# Patient Record
Sex: Male | Born: 1950 | Race: White | Hispanic: No | Marital: Married | Smoking: Former smoker
Health system: Southern US, Community
[De-identification: ages and names within clinical notes are randomized; demographics above are authoritative.]

## PROBLEM LIST (undated history)

## (undated) DIAGNOSIS — R600 Localized edema: Secondary | ICD-10-CM

## (undated) DIAGNOSIS — K7460 Unspecified cirrhosis of liver: Secondary | ICD-10-CM

## (undated) DIAGNOSIS — K769 Liver disease, unspecified: Secondary | ICD-10-CM

## (undated) DIAGNOSIS — K602 Anal fissure, unspecified: Secondary | ICD-10-CM

## (undated) DIAGNOSIS — G4730 Sleep apnea, unspecified: Secondary | ICD-10-CM

## (undated) DIAGNOSIS — G5600 Carpal tunnel syndrome, unspecified upper limb: Secondary | ICD-10-CM

## (undated) DIAGNOSIS — D649 Anemia, unspecified: Secondary | ICD-10-CM

## (undated) DIAGNOSIS — A4152 Sepsis due to Pseudomonas: Secondary | ICD-10-CM

## (undated) DIAGNOSIS — I5031 Acute diastolic (congestive) heart failure: Secondary | ICD-10-CM

## (undated) DIAGNOSIS — I10 Essential (primary) hypertension: Secondary | ICD-10-CM

## (undated) DIAGNOSIS — N400 Enlarged prostate without lower urinary tract symptoms: Secondary | ICD-10-CM

## (undated) HISTORY — DX: Essential (primary) hypertension: I10

## (undated) HISTORY — DX: Anemia, unspecified: D64.9

## (undated) HISTORY — PX: INGUINAL HERNIA REPAIR: SUR1180

## (undated) HISTORY — DX: Unspecified cirrhosis of liver: K74.60

## (undated) HISTORY — DX: Localized edema: R60.0

## (undated) HISTORY — DX: Acute diastolic (congestive) heart failure: I50.31

## (undated) HISTORY — DX: Sleep apnea, unspecified: G47.30

## (undated) HISTORY — DX: Benign prostatic hyperplasia without lower urinary tract symptoms: N40.0

## (undated) HISTORY — PX: CARPAL TUNNEL RELEASE: SHX101

## (undated) HISTORY — DX: Sepsis due to Pseudomonas: A41.52

## (undated) HISTORY — DX: Carpal tunnel syndrome, unspecified upper limb: G56.00

## (undated) HISTORY — DX: Liver disease, unspecified: K76.9

## (undated) HISTORY — DX: Anal fissure, unspecified: K60.2

## (undated) HISTORY — PX: HEMORRHOID SURGERY: SHX153

---

## 2007-07-09 ENCOUNTER — Ambulatory Visit: Payer: Self-pay | Admitting: Internal Medicine

## 2007-07-24 ENCOUNTER — Ambulatory Visit: Payer: Self-pay | Admitting: Internal Medicine

## 2007-08-09 ENCOUNTER — Ambulatory Visit: Payer: Self-pay | Admitting: Internal Medicine

## 2007-09-06 ENCOUNTER — Ambulatory Visit: Payer: Self-pay | Admitting: Internal Medicine

## 2007-09-14 ENCOUNTER — Ambulatory Visit: Payer: Self-pay

## 2007-09-23 ENCOUNTER — Ambulatory Visit: Payer: Self-pay | Admitting: Unknown Physician Specialty

## 2007-10-07 ENCOUNTER — Ambulatory Visit: Payer: Self-pay

## 2007-10-07 ENCOUNTER — Ambulatory Visit: Payer: Self-pay | Admitting: Internal Medicine

## 2007-10-27 ENCOUNTER — Ambulatory Visit: Payer: Self-pay

## 2007-11-06 ENCOUNTER — Ambulatory Visit: Payer: Self-pay | Admitting: Radiation Oncology

## 2007-11-06 ENCOUNTER — Ambulatory Visit: Payer: Self-pay

## 2007-12-07 ENCOUNTER — Ambulatory Visit: Payer: Self-pay

## 2007-12-11 ENCOUNTER — Ambulatory Visit: Payer: Self-pay | Admitting: Internal Medicine

## 2008-02-16 ENCOUNTER — Ambulatory Visit: Payer: Self-pay

## 2008-03-03 ENCOUNTER — Other Ambulatory Visit: Payer: Self-pay

## 2008-03-03 ENCOUNTER — Ambulatory Visit: Payer: Self-pay | Admitting: General Practice

## 2008-03-08 ENCOUNTER — Ambulatory Visit: Payer: Self-pay

## 2008-03-17 ENCOUNTER — Ambulatory Visit: Payer: Self-pay | Admitting: General Practice

## 2008-06-16 ENCOUNTER — Encounter: Payer: Self-pay | Admitting: Cardiovascular Disease

## 2008-08-04 ENCOUNTER — Ambulatory Visit: Payer: Self-pay | Admitting: Otolaryngology

## 2008-08-25 ENCOUNTER — Ambulatory Visit: Payer: Self-pay | Admitting: Otolaryngology

## 2008-10-12 ENCOUNTER — Encounter: Payer: Self-pay | Admitting: Cardiovascular Disease

## 2009-08-07 ENCOUNTER — Encounter: Payer: Self-pay | Admitting: Cardiovascular Disease

## 2009-08-16 ENCOUNTER — Ambulatory Visit: Payer: Self-pay | Admitting: Cardiovascular Disease

## 2009-08-16 DIAGNOSIS — Z87898 Personal history of other specified conditions: Secondary | ICD-10-CM

## 2009-08-16 DIAGNOSIS — K7460 Unspecified cirrhosis of liver: Secondary | ICD-10-CM

## 2009-08-16 DIAGNOSIS — K259 Gastric ulcer, unspecified as acute or chronic, without hemorrhage or perforation: Secondary | ICD-10-CM | POA: Insufficient documentation

## 2009-08-16 DIAGNOSIS — Z8669 Personal history of other diseases of the nervous system and sense organs: Secondary | ICD-10-CM

## 2009-08-16 DIAGNOSIS — G4730 Sleep apnea, unspecified: Secondary | ICD-10-CM | POA: Insufficient documentation

## 2009-08-16 DIAGNOSIS — N401 Enlarged prostate with lower urinary tract symptoms: Secondary | ICD-10-CM

## 2009-09-05 ENCOUNTER — Encounter: Payer: Self-pay | Admitting: Cardiovascular Disease

## 2009-09-05 ENCOUNTER — Ambulatory Visit: Payer: Self-pay

## 2009-09-07 ENCOUNTER — Ambulatory Visit: Payer: Self-pay | Admitting: Cardiovascular Disease

## 2009-09-11 ENCOUNTER — Telehealth: Payer: Self-pay | Admitting: Cardiovascular Disease

## 2010-01-29 ENCOUNTER — Ambulatory Visit: Payer: Self-pay | Admitting: Unknown Physician Specialty

## 2010-08-07 NOTE — Assessment & Plan Note (Signed)
Summary: F/U 2 WEEKS   Visit Type:  Follow-up Referring Provider:  Lenise Jr Primary Provider:  Kern Alberta  CC:  edema in ankles and feet.  History of Present Illness: 60 yo male, patient of Dr. 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 He presents for f/u.   Mr. Raul Del states that overall he has been feeling well. He states that he is also significant weight over the past year but continues to need more weight reduction. He follows up with Dr. Raford Pitcher at [facility] for his liver issues and states that he had an MRI done in December 2010.  Echocardiogram showed grossly normal systolic function, no significant valvular disease, normal right ventricular systolic pressures. He continues to have lower extremity edema bilaterally most notably below the knees. It is better in the morning and certainly worse by the end of the day after he has been on his feet. He does exercise on a treadmill and an elliptical a periodic basis.    Current Problems (verified): 1)  Morbid Obesity  (ICD-278.01) 2)  Sleep Apnea  (ICD-780.57) 3)  Edema- Localized  (ICD-782.3) 4)  Carpal Tunnel Syndrome, Hx of  (ICD-V12.49) 5)  Hepatic Cirrhosis  (ICD-571.5) 6)  Inguinal Hernia, Hx of  (ICD-V13.8) 7)  Gastric Ulcer  (ICD-531.90) 8)  Benign Prostatic Hypertrophy, With Obstruction  (ICD-600.01)  Current Medications (verified): 1)  Nadolol 20 Mg Tabs (Nadolol) .Marland Kitchen.. 1 Tab By Mouth Daily 2)  Omeprazole 20 Mg Cpdr (Omeprazole) .... 2 Tab By Mouth Daily 3)  Flomax 0.4 Mg Caps (Tamsulosin Hcl) .Marland Kitchen.. 1 Tab By Mouth Daily 4)  Furosemide 40 Mg Tabs (Furosemide) .Marland Kitchen.. 1 Tab By Mouth Daily 5)  Spironolactone 100 Mg Tabs (Spironolactone) .Marland Kitchen.. 1 Tab By Mouth Daily 6)  Nasonex 50 Mcg/act Susp (Mometasone Furoate) .Marland Kitchen.. 1 Spray in Each Nostril Daily 7)  Cpap .... Use At  Bedtime  Allergies (verified): No Known Drug Allergies  Past History:  Past Medical History: Last updated: 08/16/2009 BPH gastric ulcer inguinal hernia --repair cirrhosis of the liver carpel tunnel lower extremity edema sleep apnea -- CPAP  Past Surgical History: Last updated: 08/16/2009 inguinal hernia repair carpel tunnel repair  Family History: Last updated: 08/16/2009 Mother- lung CA Father- prostrate CA, skin CA, CAD, DM, HTN, nephorlithasis  Review of Systems       The patient complains of peripheral edema.  The patient denies fatigue, malaise, fever, weight gain/loss, vision loss, decreased hearing, hoarseness, chest pain, palpitations, shortness of breath, prolonged cough, wheezing, sleep apnea, coughing up blood, abdominal pain, blood in stool, nausea, vomiting, diarrhea, heartburn, incontinence, blood in urine, muscle weakness, joint pain, leg swelling, rash, skin lesions, headache, fainting, dizziness, depression, anxiety, enlarged lymph nodes, easy bruising or bleeding, and environmental allergies.    Vital Signs:  Patient profile:   60 year old male Height:      71 inches Weight:      331 pounds BMI:     46.33 Pulse rate:   52 / minute Pulse rhythm:   regular BP sitting:   120 / 68  (left arm) Cuff size:   large  Vitals Entered By: Mercer Pod (September 07, 2009 10:49 AM)   Impression & Recommendations:  Problem # 1:  EDEMA- LOCALIZED (ICD-782.3) the echocardiogram shows no LV dysfunction, normal right ventricular systolic pressures. Given  this, his edema is likely due to chronic venous stasis. His weight/abdomen may also be contributing to elevated venous pressures. We have suggested that he try TED hose below the knees bilaterally. He also may want to be evaluated by a vascular/vein specialist and if his symptoms get worse, have vein mapping.  Problem # 2:  MORBID OBESITY (ICD-278.01) We did talk to him about his weight. He is certainly at risk of  coronary disease given his strong family history. His father had an MI at age 60 with bypass at age 30 with redo bypass several years later. We'll try to obtain his most recent lipid panel from Dr. Lorri Frederick. Certainly needs to lose more weight. I will try to obtain his MRI report from [facility] to see if the coronary anatomy was visualized and whether he has the start of coronary atherosclerosis. If he does have underlying coronary disease seen on imaging, he may benefit from a low-dose statin even despite his underlying NASH. We would have to discuss this with his hepatologist.  As he is over 50 and did slightly higher risk given his family history, smoking history, and underlying obesity, he may benefit from a low-dose aspirin. Will try to get his platelet count and will discuss this with his hepatologist prior to initiating a low-dose aspirin.  Patient Instructions: 1)  Your physician recommends that you schedule a follow-up appointment in: 1 year 2)  Your physician recommends that you continue on your current medications as directed. Please refer to the Current Medication list given to you today.

## 2010-08-07 NOTE — Progress Notes (Signed)
Summary: [employer]prim[employer]  Imprimis   Imported By: Harlon Flor 08/22/2009 09:17:56  _____________________________________________________________________  External Attachment:    Type:   Image     Comment:   External Document

## 2010-08-07 NOTE — Progress Notes (Signed)
Summary: [employer]  [employer]   Imported By: Harlon Flor 08/22/2009 09:18:21  _____________________________________________________________________  External Attachment:    Type:   Image     Comment:   External Document

## 2010-08-07 NOTE — Progress Notes (Signed)
Summary: CALL BACK  Phone Note Call from Patient Call back at Work Phone [phone]   Caller: SELF Call For: [facility] Summary of Call: RETURNING A CALL TO [facility] FROM FRIDAY Initial call taken by: Harlon Flor,  September 11, 2009 11:58 AM  Follow-up for Phone Call        gave pt results of echo Follow-up by: Mercer Pod,  September 11, 2009 4:09 PM

## 2010-08-07 NOTE — Progress Notes (Signed)
Summary: [employer]  [employer]   Imported By: Harlon Flor 08/22/2009 09:18:42  _____________________________________________________________________  External Attachment:    Type:   Image     Comment:   External Document

## 2010-08-07 NOTE — Progress Notes (Signed)
Summary: PHI  PHI   Imported By: Harlon Flor 08/22/2009 09:27:32  _____________________________________________________________________  External Attachment:    Type:   Image     Comment:   External Document

## 2010-08-07 NOTE — Assessment & Plan Note (Signed)
Summary: NEW PT;   Visit Type:  new pt visit Primary Provider:  Kern Alberta  CC:  no cardiac complaints today.  History of Present Illness: 60 yo male, patient of Dr. 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 He presents for cardiac evaluation.  Mr. Shugars states that he has no significant SOB with exertion, no chest pain. His LE edema is nontender, improves with leg elevation. He has been on lasix, spironolactone with no significant improvement. He exercises on a treadmill several times a week, per his report. He denies ABD swelling, PND, orthopnea. No cough.     Problems Prior to Update: 1)  Sleep Apnea  (ICD-780.57) 2)  Edema- Localized  (ICD-782.3) 3)  Carpal Tunnel Syndrome, Hx of  (ICD-V12.49) 4)  Hepatic Cirrhosis  (ICD-571.5) 5)  Inguinal Hernia, Hx of  (ICD-V13.8) 6)  Gastric Ulcer  (ICD-531.90) 7)  Benign Prostatic Hypertrophy, With Obstruction  (ICD-600.01)  Current Medications (verified): 1)  Nadolol 20 Mg Tabs (Nadolol) .Marland Kitchen.. 1 Tab By Mouth Daily 2)  Omeprazole 20 Mg Cpdr (Omeprazole) .... 2 Tab By Mouth Daily 3)  Flomax 0.4 Mg Caps (Tamsulosin Hcl) .Marland Kitchen.. 1 Tab By Mouth Daily 4)  Furosemide 40 Mg Tabs (Furosemide) .Marland Kitchen.. 1 Tab By Mouth Daily 5)  Spironolactone 100 Mg Tabs (Spironolactone) .Marland Kitchen.. 1 Tab By Mouth Daily 6)  Nasonex 50 Mcg/act Susp (Mometasone Furoate) .Marland Kitchen.. 1 Spray in Each Nostril Daily  Allergies (verified): No Known Drug Allergies  Vital Signs:  Patient profile:   60 year old male Height:      71 inches Weight:      324 pounds BMI:     45.35 Pulse rate:   54 / minute Pulse rhythm:   regular BP sitting:   134 / 74  (left arm) Cuff size:   large  Vitals Entered By: Danielle Rankin, CMA (August 16, 2009 3:21 PM)  Physical Exam  General:  HEENT is benign, neck is supple with no carotid bruits, no  JVP, cv: RRR with no murmur, resp: CTA b/l, ABD: Benign, obese, NT, ND, Extr:2+ LE edema to below the knees,  neuro:  no focal deficts, pulses: equal and symmetrical upper and lower extremities.   New Orders:     1)  Echocardiogram (Echo)   EKG  Procedure date:  08/16/2009  Findings:      NSR, no significant ST or T wave changes. Rate of 55 bpm.   Impression & Recommendations:  Problem # 1:  EDEMA- LOCALIZED (ICD-782.3) Etiology of the edema is uncertain. We have ordered an ECHO to evaluate cardiac function, r/o valve d/o, measure RVSP (and pulmonary HTN). If his echo is grossly normal, his edema may be seconadry to venous stasis. We will try to obtain his labs to evaluate albumin level.  Uncertain if edema is secondary to underlying cirrhosis though there is no ABD swelling suggesting decompensated liver disease.  Orders: Echocardiogram (Echo)  Problem # 2:  HEPATIC CIRRHOSIS (ICD-571.5) Followed by GI, Dr. Markham Jordan. Presentation concerning for NASH, though details are not available. Varices by history.   Problem # 3:  SLEEP APNEA (ICD-780.57) Currently using CPAP and states he is doing well.  Problem # 4:  MORBID OBESITY (ICD-278.01) Have discussed with him that his weight is a risk factor for numerous disease processes, includingn cardiac disease. Will discuss further  with him in f/u after echo results obtained. Will try to obtain most recent cholesterol.   Patient Instructions: 1)  Your physician recommends that you schedule a follow-up appointment in: 2 weeks 2)  Your physician has requested that you have an echocardiogram.  Echocardiography is a painless test that uses sound waves to create images of your heart. It provides your doctor with information about the size and shape of your heart and how well your heart's chambers and valves are working.  This procedure takes approximately one hour. There are no restrictions for this procedure.  Prevention & Chronic  Care Immunizations   Influenza vaccine: Not documented    Tetanus booster: Not documented    Pneumococcal vaccine: Not documented  Colorectal Screening   Hemoccult: Not documented    Colonoscopy: Not documented  Other Screening   PSA: Not documented   Smoking status: Not documented  Lipids   Total Cholesterol: Not documented   LDL: Not documented   LDL Direct: Not documented   HDL: Not documented   Triglycerides: Not documented   Appended Document: NEW PT; No recent cholesterol labs available

## 2010-08-07 NOTE — Letter (Signed)
Summary: Medical Record Release  Medical Record Release   Imported By: Harlon Flor 09/08/2009 10:20:08  _____________________________________________________________________  External Attachment:    Type:   Image     Comment:   External Document

## 2010-12-24 ENCOUNTER — Inpatient Hospital Stay: Payer: Self-pay | Admitting: Internal Medicine

## 2010-12-24 ENCOUNTER — Ambulatory Visit: Payer: Self-pay | Admitting: Family Medicine

## 2010-12-25 DIAGNOSIS — I369 Nonrheumatic tricuspid valve disorder, unspecified: Secondary | ICD-10-CM

## 2011-01-16 ENCOUNTER — Encounter: Payer: Self-pay | Admitting: Cardiovascular Disease

## 2011-05-15 ENCOUNTER — Encounter: Payer: Self-pay | Admitting: Cardiovascular Disease

## 2011-05-17 ENCOUNTER — Encounter: Payer: Self-pay | Admitting: Cardiovascular Disease

## 2011-05-17 ENCOUNTER — Ambulatory Visit (INDEPENDENT_AMBULATORY_CARE_PROVIDER_SITE_OTHER): Payer: PRIVATE HEALTH INSURANCE | Admitting: Cardiovascular Disease

## 2011-05-17 DIAGNOSIS — R609 Edema, unspecified: Secondary | ICD-10-CM

## 2011-05-17 DIAGNOSIS — Z8249 Family history of ischemic heart disease and other diseases of the circulatory system: Secondary | ICD-10-CM

## 2011-05-17 DIAGNOSIS — R0602 Shortness of breath: Secondary | ICD-10-CM

## 2011-05-17 DIAGNOSIS — G4730 Sleep apnea, unspecified: Secondary | ICD-10-CM

## 2011-05-17 DIAGNOSIS — K7460 Unspecified cirrhosis of liver: Secondary | ICD-10-CM

## 2011-05-17 NOTE — Patient Instructions (Signed)
You are doing well. No medication changes were made.  Gastric bypass/Lap band: Dr. Primus Bravo [phone]  Please call us if you have new issues that need to be addressed before your next appt.  The office will contact you for a follow up Appt. In 12 months

## 2011-05-19 NOTE — Assessment & Plan Note (Signed)
Underlying sleep apnea for obesity is another reason for lab or gastric bypass procedure

## 2011-05-19 NOTE — Progress Notes (Signed)
Patient ID: [name], male    DOB: [birth year], 60 y.o.   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 He presents for f/u.      He follows up with Dr. Raford Pitcher at [facility] for his liver issues and states that he had an MRI done in December 2010. He has been on Lasix for lower extremity edema. He was recently admitted to the hospital at the end of June for acute respiratory failure, started on BiPAP, also with hypotension, acute renal failure secondary to hypovolemia. He was in the ICU for several days on BiPAP and pressors. It was felt that he had bacteremia as he had chills and fever prior to presentation. Notes indicate etiology of his infection was uncertain. He was started on Zosyn for 7 days. Possibly related to his underlying liver disease (?SBP).  BUN 2 6 and creatinine 2.07 on arrival to the hospital. Creatinine on discharge is 1.0  He has had no further episodes since that time   Echocardiogram done in the hospital showed normal LV systolic function, normal biventricular systolic pressures  EKG today shows normal sinus rhythm with rate 59 beats per minute, no significant ST or T wave changes      Outpatient Encounter Prescriptions as of 05/17/2011  Medication Sig Dispense Refill  . furosemide (LASIX) 40 MG tablet Take 40 mg alternate with 60 mg daily.      . mometasone (NASONEX) 50 MCG/ACT nasal spray Place 2 sprays into the nose daily.        . nadolol (CORGARD) 20 MG tablet Take 20 mg by mouth daily.        . NON FORMULARY CPAP. Use at bedtime.       Marland Kitchen omeprazole (PRILOSEC) 20 MG capsule Take 40 mg by mouth daily.        Marland Kitchen spironolactone (ALDACTONE) 100 MG tablet Take one (100 mg) tablet alternate with one (100 mg) and half (50)  tablet daily.      Marland Kitchen DISCONTD: Tamsulosin HCl (FLOMAX) 0.4 MG CAPS Take 0.4  mg by mouth daily.           Review of Systems  Constitutional: Negative.   HENT: Negative.   Eyes: Negative.   Respiratory: Negative.   Cardiovascular: Positive for leg swelling.  Gastrointestinal: Negative.   Musculoskeletal: Negative.   Skin: Negative.   Neurological: Negative.   Hematological: Negative.   Psychiatric/Behavioral: Negative.   All other systems reviewed and are negative.    BP 140/62  Pulse 59  Ht 5\' 11"  (1.803 m)  Wt 372 lb (168.738 kg)  BMI 51.88 kg/m2  Physical Exam  Nursing note and vitals reviewed. Constitutional: He is oriented to person, place, and time. He appears well-developed and well-nourished.  HENT:  Head: Normocephalic.  Nose: Nose normal.  Mouth/Throat: Oropharynx is clear and moist.  Eyes: Conjunctivae are normal. Pupils are equal, round, and reactive to light.  Neck: Normal range of motion. Neck supple. No JVD present.  Cardiovascular: Normal rate, regular rhythm, S1 normal, S2 normal, normal heart sounds and intact distal pulses.  Exam reveals no gallop and no friction rub.   No murmur heard. Pulmonary/Chest: Effort normal and breath sounds normal. No respiratory distress. He has no wheezes. He has no rales. He exhibits no tenderness.  Abdominal: Soft. Bowel sounds are  normal. He exhibits no distension. There is no tenderness.  Musculoskeletal: Normal range of motion. He exhibits no edema and no tenderness.  Lymphadenopathy:    He has no cervical adenopathy.  Neurological: He is alert and oriented to person, place, and time. Coordination normal.  Skin: Skin is warm and dry. No rash noted. No erythema.  Psychiatric: He has a normal mood and affect. His behavior is normal. Judgment and thought content normal.           Assessment and Plan

## 2011-05-19 NOTE — Assessment & Plan Note (Signed)
[name] is secondary to obesity. He does have cirrhosis, varices as well and is followed by hepatology ( at [facility]?) We have stressed the importance of weight loss.

## 2011-05-19 NOTE — Assessment & Plan Note (Signed)
Edema is likely secondary to venous insufficiency, exacerbated by his obesity. Possible component of lymphedema. We have encouraged TED hose, gentle diuresis only.

## 2011-05-19 NOTE — Assessment & Plan Note (Signed)
We had a long discussion with him concerning his obesity. We have given him a contact number for Dr. Smitty Cords in [facility] for evaluation for left and/or gastric bypass surgery. His obesity is causing significant health problems including underlying cirrhosis from [place].

## 2011-06-28 ENCOUNTER — Encounter: Payer: Self-pay | Admitting: Cardiovascular Disease

## 2012-07-08 DIAGNOSIS — D649 Anemia, unspecified: Secondary | ICD-10-CM

## 2012-07-08 DIAGNOSIS — K602 Anal fissure, unspecified: Secondary | ICD-10-CM

## 2012-07-08 HISTORY — DX: Anal fissure, unspecified: K60.2

## 2012-07-08 HISTORY — DX: Anemia, unspecified: D64.9

## 2012-07-17 ENCOUNTER — Ambulatory Visit (INDEPENDENT_AMBULATORY_CARE_PROVIDER_SITE_OTHER): Payer: 59 | Admitting: Cardiovascular Disease

## 2012-07-17 ENCOUNTER — Encounter: Payer: Self-pay | Admitting: Cardiovascular Disease

## 2012-07-17 VITALS — BP 138/60 | HR 61 | Ht 71.0 in | Wt 382.5 lb

## 2012-07-17 DIAGNOSIS — N401 Enlarged prostate with lower urinary tract symptoms: Secondary | ICD-10-CM

## 2012-07-17 DIAGNOSIS — K7460 Unspecified cirrhosis of liver: Secondary | ICD-10-CM

## 2012-07-17 DIAGNOSIS — R609 Edema, unspecified: Secondary | ICD-10-CM

## 2012-07-17 DIAGNOSIS — R0602 Shortness of breath: Secondary | ICD-10-CM

## 2012-07-17 DIAGNOSIS — D649 Anemia, unspecified: Secondary | ICD-10-CM | POA: Insufficient documentation

## 2012-07-17 NOTE — Patient Instructions (Addendum)
You are doing well. No medication changes were made.  Please call us if you have new issues that need to be addressed before your next appt.  Your physician wants you to follow-up in: 12 months.  You will receive a reminder letter in the mail two months in advance. If you don't receive a letter, please call our office to schedule the follow-up appointment. 

## 2012-07-17 NOTE — Assessment & Plan Note (Signed)
Underlying anemia would likely contribute to his shortness of breath, edema. Recent blood loss from hemorrhoid surgeries. Uncertain if he has iron deficiency. We have recommended he eat foods rich in iron, possibly take low-dose over-the-counter iron supplement.

## 2012-07-17 NOTE — Assessment & Plan Note (Signed)
Stable nonpitting edema. Likely secondary to venous insufficiency, unable to exclude some component of lymphedema. Edema likely be made worse by underlying anemia and obesity, diastolic  CHF. We have suggested he stay on Lasix twice a day.

## 2012-07-17 NOTE — Assessment & Plan Note (Signed)
Followed  At [facility]. Elita Boone.

## 2012-07-17 NOTE — Progress Notes (Signed)
Patient ID: [name], male    DOB: Aug 10, 1950, 62 y.o.   MRN: [id number]  HPI Comments: 62 year old male with pmh of obesity, cirrhosis secondary to NASH, with varices on prior EGD, OSA on CPAP, smoking hx x 25 years (stopped in 2000), with chronic lower extremity edema. He presents for f/u.      He follows up with Dr. Raford Pitcher at [facility] for his liver issues. MRI done in December 2010. He has been on Lasix for lower extremity edema 40 mg twice a day.   admitted to the hospital at the end of June 2012 for acute respiratory failure, started on BiPAP, also with hypotension, acute renal failure secondary to hypovolemia. He was in the ICU for several days on BiPAP and pressors. It was felt that he had bacteremia as he had chills and fever prior to presentation. Notes indicate etiology of his infection was uncertain. He was started on Zosyn for 7 days. Possibly related to his underlying liver disease (?SBP).  BUN 2 6 and creatinine 2.07 on arrival to the hospital. Creatinine on discharge is 1.0  He reports that he's had problems with hemorrhoids, recent surgery for hemorrhoid banding. This was done in November 2013. He had postop pain control issues, urine retention and stayed in the hospital. Also a diagnosis of anal fissure that still needs surgical correction.  He has anemia with recent hematocrit 28 and low platelets. Scheduled for EGD and colonoscopy given his history of varices and portal hypertension.  He reports that he has shortness of breath but this is stable and he attributes this to his obesity, deconditioning, also likely his anemia   Prior Echocardiogram done in the hospital showed normal LV systolic function, normal biventricular systolic pressures no significant valvular pathology  EKG today shows normal sinus rhythm with rate 61 beats per minute, no significant ST or T wave changes      Outpatient Encounter Prescriptions as of 07/17/2012  Medication Sig Dispense  Refill  . furosemide (LASIX) 40 MG tablet Take 40 mg by mouth 2 (two) times daily.       Marland Kitchen lidocaine (XYLOCAINE) 2 % jelly Apply 1 application topically daily as needed.       . mometasone (NASONEX) 50 MCG/ACT nasal spray Place 2 sprays into the nose daily as needed.       . nadolol (CORGARD) 20 MG tablet Take 20 mg by mouth daily.        Marland Kitchen NIFEdipine POWD as needed.       . NON FORMULARY CPAP at bedtime.      Marland Kitchen omeprazole (PRILOSEC) 20 MG capsule Take 40 mg by mouth daily.        Marland Kitchen PROCTOSOL HC 2.5 % rectal cream Place 1 application rectally 2 (two) times daily as needed.       Marland Kitchen RECTIV 0.4 % OINT       . spironolactone (ALDACTONE) 100 MG tablet Take 50 mg by mouth 2 (two) times daily.       . Tamsulosin HCl (FLOMAX) 0.4 MG CAPS Place 0.4 mg into alternate nostrils daily.          Review of Systems  Constitutional: Negative.   HENT: Negative.   Eyes: Negative.   Respiratory: Negative.   Cardiovascular: Positive for leg swelling.  Gastrointestinal: Negative.   Musculoskeletal: Negative.   Skin: Negative.   Neurological: Negative.   Hematological: Negative.   Psychiatric/Behavioral: Negative.   All other systems reviewed and are negative.  BP 138/60  Pulse 61  Ht 5\' 11"  (1.803 m)  Wt 382 lb 8 oz (173.501 kg)  BMI 53.35 kg/m2  Physical Exam  Nursing note and vitals reviewed. Constitutional: He is oriented to person, place, and time. He appears well-developed and well-nourished.  HENT:  Head: Normocephalic.  Nose: Nose normal.  Mouth/Throat: Oropharynx is clear and moist.  Eyes: Conjunctivae normal are normal. Pupils are equal, round, and reactive to light.  Neck: Normal range of motion. Neck supple. No JVD present.  Cardiovascular: Normal rate, regular rhythm, S1 normal, S2 normal, normal heart sounds and intact distal pulses.  Exam reveals no gallop and no friction rub.   No murmur heard.      Edema is nonpitting, compression hose in place  Pulmonary/Chest: Effort  normal and breath sounds normal. No respiratory distress. He has no wheezes. He has no rales. He exhibits no tenderness.  Abdominal: Soft. Bowel sounds are normal. He exhibits no distension. There is no tenderness.  Musculoskeletal: Normal range of motion. He exhibits edema. He exhibits no tenderness.  Lymphadenopathy:    He has no cervical adenopathy.  Neurological: He is alert and oriented to person, place, and time. Coordination normal.  Skin: Skin is warm and dry. No rash noted. No erythema.  Psychiatric: He has a normal mood and affect. His behavior is normal. Judgment and thought content normal.           Assessment and Plan

## 2012-07-17 NOTE — Assessment & Plan Note (Addendum)
Limited in his ability to exercise. We have asked him to watch his diet closely. We did discuss microscopic banding eared uncertain if he would be a candidate. He reports no insurance coverage for this procedure.

## 2012-09-05 ENCOUNTER — Ambulatory Visit: Payer: Self-pay | Admitting: Family Medicine

## 2012-12-31 ENCOUNTER — Inpatient Hospital Stay: Payer: Self-pay | Admitting: Internal Medicine

## 2012-12-31 LAB — BASIC METABOLIC PANEL
BUN: 19 mg/dL — ABNORMAL HIGH (ref 7–18)
Calcium, Total: 8.4 mg/dL — ABNORMAL LOW (ref 8.5–10.1)
Co2: 27 mmol/L (ref 21–32)
Glucose: 98 mg/dL (ref 65–99)
Potassium: 4.1 mmol/L (ref 3.5–5.1)
Sodium: 140 mmol/L (ref 136–145)

## 2012-12-31 LAB — CBC
HGB: 8.3 g/dL — ABNORMAL LOW (ref 13.0–18.0)
MCH: 24 pg — ABNORMAL LOW (ref 26.0–34.0)
MCV: 74 fL — ABNORMAL LOW (ref 80–100)
Platelet: 68 10*3/uL — ABNORMAL LOW (ref 150–440)
RBC: 3.44 10*6/uL — ABNORMAL LOW (ref 4.40–5.90)
RDW: 21.2 % — ABNORMAL HIGH (ref 11.5–14.5)
WBC: 8.5 10*3/uL (ref 3.8–10.6)

## 2012-12-31 LAB — PROTIME-INR
INR: 1.7
Prothrombin Time: 19.5 secs — ABNORMAL HIGH (ref 11.5–14.7)

## 2012-12-31 LAB — HEPATIC FUNCTION PANEL A (ARMC)
Bilirubin, Direct: 0.6 mg/dL — ABNORMAL HIGH (ref 0.00–0.20)
Bilirubin,Total: 2.5 mg/dL — ABNORMAL HIGH (ref 0.2–1.0)
SGPT (ALT): 31 U/L (ref 12–78)
Total Protein: 7.3 g/dL (ref 6.4–8.2)

## 2012-12-31 LAB — CK TOTAL AND CKMB (NOT AT ARMC): CK-MB: 0.8 ng/mL (ref 0.5–3.6)

## 2012-12-31 LAB — TROPONIN I: Troponin-I: <0.02 ng/mL

## 2013-01-01 LAB — CBC WITH DIFFERENTIAL/PLATELET
Basophil #: 0 10*3/uL (ref 0.0–0.1)
Basophil %: 0 %
Eosinophil %: 0 %
HCT: 22.3 % — ABNORMAL LOW (ref 40.0–52.0)
HGB: 7.5 g/dL — ABNORMAL LOW (ref 13.0–18.0)
Lymphocyte #: 0.4 10*3/uL — ABNORMAL LOW (ref 1.0–3.6)
Lymphocyte %: 3.4 %
MCH: 24.8 pg — ABNORMAL LOW (ref 26.0–34.0)
MCHC: 33.4 g/dL (ref 32.0–36.0)
MCV: 74 fL — ABNORMAL LOW (ref 80–100)
Monocyte %: 5.7 %
Neutrophil #: 9.6 10*3/uL — ABNORMAL HIGH (ref 1.4–6.5)
Platelet: 54 10*3/uL — ABNORMAL LOW (ref 150–440)
WBC: 10.6 10*3/uL (ref 3.8–10.6)

## 2013-01-01 LAB — BASIC METABOLIC PANEL
Anion Gap: 8 (ref 7–16)
BUN: 24 mg/dL — ABNORMAL HIGH (ref 7–18)
Calcium, Total: 8.1 mg/dL — ABNORMAL LOW (ref 8.5–10.1)
Chloride: 104 mmol/L (ref 98–107)
Creatinine: 1.64 mg/dL — ABNORMAL HIGH (ref 0.60–1.30)
EGFR (African American): 52 — ABNORMAL LOW
EGFR (Non-African Amer.): 44 — ABNORMAL LOW
Osmolality: 279 (ref 275–301)
Sodium: 136 mmol/L (ref 136–145)

## 2013-01-02 DIAGNOSIS — I369 Nonrheumatic tricuspid valve disorder, unspecified: Secondary | ICD-10-CM

## 2013-01-02 LAB — BASIC METABOLIC PANEL
Anion Gap: 9 (ref 7–16)
Calcium, Total: 7.9 mg/dL — ABNORMAL LOW (ref 8.5–10.1)
Co2: 23 mmol/L (ref 21–32)
EGFR (African American): 49 — ABNORMAL LOW
EGFR (Non-African Amer.): 42 — ABNORMAL LOW
Glucose: 108 mg/dL — ABNORMAL HIGH (ref 65–99)
Potassium: 4.2 mmol/L (ref 3.5–5.1)
Sodium: 136 mmol/L (ref 136–145)

## 2013-01-03 LAB — BASIC METABOLIC PANEL
Anion Gap: 7 (ref 7–16)
BUN: 30 mg/dL — ABNORMAL HIGH (ref 7–18)
Chloride: 102 mmol/L (ref 98–107)
Co2: 25 mmol/L (ref 21–32)
Creatinine: 1.42 mg/dL — ABNORMAL HIGH (ref 0.60–1.30)
Glucose: 89 mg/dL (ref 65–99)
Potassium: 3.7 mmol/L (ref 3.5–5.1)

## 2013-01-03 LAB — CULTURE, BLOOD (SINGLE)

## 2013-01-06 ENCOUNTER — Encounter: Payer: PRIVATE HEALTH INSURANCE | Admitting: Cardiovascular Disease

## 2013-01-07 LAB — CULTURE, BLOOD (SINGLE)

## 2013-01-14 ENCOUNTER — Encounter: Payer: Self-pay | Admitting: Cardiovascular Disease

## 2013-01-14 ENCOUNTER — Ambulatory Visit (INDEPENDENT_AMBULATORY_CARE_PROVIDER_SITE_OTHER): Payer: PRIVATE HEALTH INSURANCE | Admitting: Cardiovascular Disease

## 2013-01-14 VITALS — BP 120/54 | HR 50 | Ht 71.0 in | Wt 381.5 lb

## 2013-01-14 DIAGNOSIS — D649 Anemia, unspecified: Secondary | ICD-10-CM

## 2013-01-14 DIAGNOSIS — I5032 Chronic diastolic (congestive) heart failure: Secondary | ICD-10-CM

## 2013-01-14 DIAGNOSIS — I509 Heart failure, unspecified: Secondary | ICD-10-CM

## 2013-01-14 DIAGNOSIS — K7460 Unspecified cirrhosis of liver: Secondary | ICD-10-CM

## 2013-01-14 DIAGNOSIS — R600 Localized edema: Secondary | ICD-10-CM

## 2013-01-14 DIAGNOSIS — R609 Edema, unspecified: Secondary | ICD-10-CM

## 2013-01-14 DIAGNOSIS — R0602 Shortness of breath: Secondary | ICD-10-CM

## 2013-01-14 NOTE — Assessment & Plan Note (Addendum)
PRBC x2 recently with improvement of his shortness of breath and edema. Will be important to keep his hemoglobin greater than 10. May need followup with hematology. He reports most recent hemoglobin in the 9 range up from 7. Etiology is not clear.

## 2013-01-14 NOTE — Progress Notes (Signed)
Patient ID: [name], male    DOB: 08-09-1950, 62 y.o.   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 He presents for f/u.  He has a history of chronic anemia. Recently received packed red blood cells x2 in June 2014. history of varices and portal hypertension.   He follows up with Dr. Raford Pitcher at [facility] for his liver issues. MRI done in December 2010. He has been on Lasix for lower extremity edema 40 mg twice a day.   admitted to the hospital June 2012 for acute respiratory failure, started on BiPAP, also with hypotension, acute renal failure secondary to hypovolemia. He was in the ICU for several days on BiPAP and pressors. It was felt that he had bacteremia as he had chills and fever prior to presentation. Notes indicate etiology of his infection was uncertain. He was started on Zosyn for 7 days. Possibly related to his underlying liver disease (?SBP).  BUN 2 6 and creatinine 2.07 on arrival to the hospital. Creatinine on discharge was 1.0  Recent hospital admission June 26 of discharge on June 29 for Pseudomonas septicemia, acute diastolic CHF. He had antibiotics, diuresis with improvement of his symptoms. Since discharge, he has been taking Lasix 40 mg twice a day. Report significant weight loss since his discharge. Overall he feels well. He had packed red blood cells x2.  Echocardiogram in hospital 01/02/2013 showed normal LV function, mildly dilated left atrium, moderate elevated right ventricular systolic pressures estimated at 40 mm mercury   Prior Echocardiogram done in the hospital showed normal LV systolic function, normal biventricular systolic pressures no significant valvular pathology  EKG today shows normal sinus rhythm with rate 50 beats per minute, no significant ST or T wave changes       Outpatient Encounter Prescriptions as of 01/14/2013  Medication Sig Dispense Refill  . furosemide (LASIX) 40 MG tablet Take 40 mg by mouth 2 (two) times daily.       Marland Kitchen lidocaine (XYLOCAINE) 2 % jelly Apply 1 application topically daily as needed.       . mometasone (NASONEX) 50 MCG/ACT nasal spray Place 2 sprays into the nose daily as needed.       . nadolol (CORGARD) 20 MG tablet Take 20 mg by mouth daily.        Marland Kitchen NIFEdipine POWD as needed.       . NON FORMULARY CPAP at bedtime.      Marland Kitchen omeprazole (PRILOSEC) 20 MG capsule Take 40 mg by mouth daily.        Marland Kitchen PROCTOSOL HC 2.5 % rectal cream Place 1 application rectally 2 (two) times daily as needed.       Marland Kitchen RECTIV 0.4 % OINT       . spironolactone (ALDACTONE) 50 MG tablet Take 100 mg by mouth daily.      . Tamsulosin HCl (FLOMAX) 0.4 MG CAPS Place 0.4 mg into alternate nostrils daily.       . [DISCONTINUED] cefUROXime (CEFTIN) 500 MG tablet       . [DISCONTINUED] spironolactone (ALDACTONE) 100 MG tablet Take 50 mg by mouth 2 (two) times daily.        No facility-administered encounter medications on file as of 01/14/2013.     Review of Systems  Constitutional: Negative.   HENT: Negative.  Eyes: Negative.   Respiratory: Negative.   Cardiovascular: Positive for leg swelling.  Gastrointestinal: Negative.   Musculoskeletal: Negative.   Skin: Negative.   Neurological: Negative.   Psychiatric/Behavioral: Negative.   All other systems reviewed and are negative.    BP 120/54  Pulse 50  Ht 5\' 11"  (1.803 m)  Wt 381 lb 8 oz (173.047 kg)  BMI 53.23 kg/m2  Physical Exam  Nursing note and vitals reviewed. Constitutional: He is oriented to person, place, and time. He appears well-developed and well-nourished.  HENT:  Head: Normocephalic.  Nose: Nose normal.  Mouth/Throat: Oropharynx is clear and moist.  Eyes: Conjunctivae are normal. Pupils are equal, round, and reactive to light.  Neck: Normal range of motion. Neck supple. No  JVD present.  Cardiovascular: Normal rate, regular rhythm, S1 normal, S2 normal, normal heart sounds and intact distal pulses.  Exam reveals no gallop and no friction rub.   No murmur heard. Edema is nonpitting, compression hose in place  Pulmonary/Chest: Effort normal and breath sounds normal. No respiratory distress. He has no wheezes. He has no rales. He exhibits no tenderness.  Abdominal: Soft. Bowel sounds are normal. He exhibits no distension. There is no tenderness.  Musculoskeletal: Normal range of motion. He exhibits edema. He exhibits no tenderness.  Lymphadenopathy:    He has no cervical adenopathy.  Neurological: He is alert and oriented to person, place, and time. Coordination normal.  Skin: Skin is warm and dry. No rash noted. No erythema.  Psychiatric: He has a normal mood and affect. His behavior is normal. Judgment and thought content normal.      Assessment and Plan

## 2013-01-14 NOTE — Patient Instructions (Addendum)
You are doing well. No medication changes were made.  We will check your labs today  Please call us if you have new issues that need to be addressed before your next appt.  Your physician wants you to follow-up in: 3 months.  You will receive a reminder letter in the mail two months in advance. If you don't receive a letter, please call our office to schedule the follow-up appointment.   

## 2013-01-14 NOTE — Assessment & Plan Note (Signed)
Obesity likely contributing to edema, fluid retention/diastolic CHF.

## 2013-01-14 NOTE — Assessment & Plan Note (Signed)
Followed by hepatology

## 2013-01-14 NOTE — Assessment & Plan Note (Signed)
Continued significant leg edema despite diuresis. Likely strong component of lymphedema. Encouraged leg wraps, leg elevation.

## 2013-01-14 NOTE — Assessment & Plan Note (Signed)
He reports that he feels well on Lasix 40 mg twice a day. I'm concerned about dehydration. We will check his basic metabolic panel today. If renal function is worsening, we'll decrease his Lasix dosing.

## 2013-01-15 LAB — BASIC METABOLIC PANEL
BUN/Creatinine Ratio: 14 (ref 10–22)
BUN: 15 mg/dL (ref 8–27)
Creatinine, Ser: 1.05 mg/dL (ref 0.76–1.27)
GFR calc Af Amer: 88 mL/min/{1.73_m2} (ref >59)
GFR calc non Af Amer: 76 mL/min/{1.73_m2} (ref >59)

## 2013-01-28 ENCOUNTER — Encounter: Payer: Self-pay | Admitting: *Deleted

## 2013-05-17 ENCOUNTER — Inpatient Hospital Stay: Payer: Self-pay | Admitting: Family Medicine

## 2013-05-17 LAB — COMPREHENSIVE METABOLIC PANEL
Alkaline Phosphatase: 102 U/L (ref 50–136)
BUN: 20 mg/dL — ABNORMAL HIGH (ref 7–18)
Chloride: 104 mmol/L (ref 98–107)
Co2: 26 mmol/L (ref 21–32)
Creatinine: 1.58 mg/dL — ABNORMAL HIGH (ref 0.60–1.30)
EGFR (African American): 54 — ABNORMAL LOW
Glucose: 113 mg/dL — ABNORMAL HIGH (ref 65–99)
Potassium: 4.3 mmol/L (ref 3.5–5.1)
SGOT(AST): 57 U/L — ABNORMAL HIGH (ref 15–37)
SGPT (ALT): 32 U/L (ref 12–78)

## 2013-05-17 LAB — URINALYSIS, COMPLETE
Bilirubin,UR: NEGATIVE
Glucose,UR: NEGATIVE mg/dL (ref 0–75)
Leukocyte Esterase: NEGATIVE
Ph: 5 (ref 4.5–8.0)
RBC,UR: NONE SEEN /HPF (ref 0–5)
Specific Gravity: 1.01 (ref 1.003–1.030)
Squamous Epithelial: NONE SEEN
WBC UR: <1 /HPF (ref 0–5)

## 2013-05-17 LAB — TROPONIN I: Troponin-I: 0.03 ng/mL

## 2013-05-17 LAB — CBC
HGB: 9.2 g/dL — ABNORMAL LOW (ref 13.0–18.0)
MCV: 82 fL (ref 80–100)

## 2013-05-17 LAB — AMMONIA: Ammonia, Plasma: 133 mcmol/L — ABNORMAL HIGH (ref 11–32)

## 2013-05-18 DIAGNOSIS — I519 Heart disease, unspecified: Secondary | ICD-10-CM

## 2013-05-18 DIAGNOSIS — K769 Liver disease, unspecified: Secondary | ICD-10-CM

## 2013-05-18 DIAGNOSIS — R609 Edema, unspecified: Secondary | ICD-10-CM

## 2013-05-18 LAB — COMPREHENSIVE METABOLIC PANEL
Albumin: 3.1 g/dL — ABNORMAL LOW (ref 3.4–5.0)
Alkaline Phosphatase: 83 U/L (ref 50–136)
Bilirubin,Total: 4.1 mg/dL — ABNORMAL HIGH (ref 0.2–1.0)
Chloride: 104 mmol/L (ref 98–107)
Creatinine: 1.78 mg/dL — ABNORMAL HIGH (ref 0.60–1.30)
Osmolality: 277 (ref 275–301)
Potassium: 3.7 mmol/L (ref 3.5–5.1)

## 2013-05-18 LAB — CBC WITH DIFFERENTIAL/PLATELET
Basophil #: 0 10*3/uL (ref 0.0–0.1)
Eosinophil #: 0.1 10*3/uL (ref 0.0–0.7)
Eosinophil %: 0.7 %
HCT: 23.3 % — ABNORMAL LOW (ref 40.0–52.0)
Lymphocyte #: 0.3 10*3/uL — ABNORMAL LOW (ref 1.0–3.6)
MCH: 28.2 pg (ref 26.0–34.0)
MCHC: 34.7 g/dL (ref 32.0–36.0)
MCV: 81 fL (ref 80–100)
Monocyte %: 6.5 %
Neutrophil %: 89.4 %
Platelet: 46 10*3/uL — ABNORMAL LOW (ref 150–440)
RBC: 2.86 10*6/uL — ABNORMAL LOW (ref 4.40–5.90)

## 2013-05-19 LAB — CBC WITH DIFFERENTIAL/PLATELET
Basophil #: 0 10*3/uL (ref 0.0–0.1)
Basophil %: 0.7 %
Eosinophil #: 0.2 10*3/uL (ref 0.0–0.7)
Eosinophil %: 3.2 %
HCT: 22.6 % — ABNORMAL LOW (ref 40.0–52.0)
Lymphocyte #: 0.5 10*3/uL — ABNORMAL LOW (ref 1.0–3.6)
Lymphocyte %: 9.6 %
MCH: 27.4 pg (ref 26.0–34.0)
MCHC: 33.4 g/dL (ref 32.0–36.0)
MCV: 82 fL (ref 80–100)
Monocyte #: 0.6 x10 3/mm (ref 0.2–1.0)
Neutrophil #: 3.8 10*3/uL (ref 1.4–6.5)
Neutrophil %: 73.8 %
Platelet: 35 10*3/uL — ABNORMAL LOW (ref 150–440)
RBC: 2.74 10*6/uL — ABNORMAL LOW (ref 4.40–5.90)

## 2013-05-19 LAB — BASIC METABOLIC PANEL
BUN: 31 mg/dL — ABNORMAL HIGH (ref 7–18)
Calcium, Total: 8.4 mg/dL — ABNORMAL LOW (ref 8.5–10.1)
Chloride: 103 mmol/L (ref 98–107)
Co2: 27 mmol/L (ref 21–32)
Creatinine: 1.74 mg/dL — ABNORMAL HIGH (ref 0.60–1.30)
EGFR (Non-African Amer.): 41 — ABNORMAL LOW
Glucose: 112 mg/dL — ABNORMAL HIGH (ref 65–99)
Potassium: 3.3 mmol/L — ABNORMAL LOW (ref 3.5–5.1)
Sodium: 134 mmol/L — ABNORMAL LOW (ref 136–145)

## 2013-05-19 LAB — VANCOMYCIN, TROUGH: Vancomycin, Trough: 12 ug/mL (ref 10–20)

## 2013-05-19 LAB — SEDIMENTATION RATE: Erythrocyte Sed Rate: 35 mm/hr — ABNORMAL HIGH (ref 0–20)

## 2013-05-20 DIAGNOSIS — I517 Cardiomegaly: Secondary | ICD-10-CM

## 2013-05-20 LAB — CBC WITH DIFFERENTIAL/PLATELET
Basophil #: 0 10*3/uL (ref 0.0–0.1)
Basophil %: 0.7 %
Eosinophil #: 0.2 10*3/uL (ref 0.0–0.7)
HCT: 21.9 % — ABNORMAL LOW (ref 40.0–52.0)
HGB: 7.5 g/dL — ABNORMAL LOW (ref 13.0–18.0)
Lymphocyte #: 0.6 10*3/uL — ABNORMAL LOW (ref 1.0–3.6)
Lymphocyte %: 18.2 %
MCH: 27.9 pg (ref 26.0–34.0)
MCV: 82 fL (ref 80–100)
Monocyte %: 19.1 %
Neutrophil #: 2 10*3/uL (ref 1.4–6.5)
Neutrophil %: 57.5 %
Platelet: 36 10*3/uL — ABNORMAL LOW (ref 150–440)
WBC: 3.5 10*3/uL — ABNORMAL LOW (ref 3.8–10.6)

## 2013-05-20 LAB — BASIC METABOLIC PANEL
Anion Gap: 7 (ref 7–16)
Calcium, Total: 8.5 mg/dL (ref 8.5–10.1)
Chloride: 104 mmol/L (ref 98–107)
Co2: 23 mmol/L (ref 21–32)
Creatinine: 1.45 mg/dL — ABNORMAL HIGH (ref 0.60–1.30)
EGFR (Non-African Amer.): 51 — ABNORMAL LOW
Glucose: 95 mg/dL (ref 65–99)
Potassium: 3.6 mmol/L (ref 3.5–5.1)

## 2013-05-20 LAB — MAGNESIUM: Magnesium: 2.1 mg/dL

## 2013-05-21 LAB — CBC WITH DIFFERENTIAL/PLATELET
Basophil #: 0 10*3/uL (ref 0.0–0.1)
Basophil %: 1.1 %
Eosinophil #: 0.1 10*3/uL (ref 0.0–0.7)
Eosinophil %: 4.3 %
HCT: 23.7 % — ABNORMAL LOW (ref 40.0–52.0)
HGB: 7.9 g/dL — ABNORMAL LOW (ref 13.0–18.0)
Lymphocyte %: 20.2 %
MCHC: 33.1 g/dL (ref 32.0–36.0)
MCV: 82 fL (ref 80–100)
Neutrophil #: 1.9 10*3/uL (ref 1.4–6.5)
RBC: 2.89 10*6/uL — ABNORMAL LOW (ref 4.40–5.90)
RDW: 19.8 % — ABNORMAL HIGH (ref 11.5–14.5)
WBC: 3.3 10*3/uL — ABNORMAL LOW (ref 3.8–10.6)

## 2013-05-21 LAB — BASIC METABOLIC PANEL
Anion Gap: 5 — ABNORMAL LOW (ref 7–16)
BUN: 21 mg/dL — ABNORMAL HIGH (ref 7–18)
Calcium, Total: 8.9 mg/dL (ref 8.5–10.1)
Co2: 23 mmol/L (ref 21–32)
EGFR (Non-African Amer.): >60
Glucose: 105 mg/dL — ABNORMAL HIGH (ref 65–99)
Osmolality: 273 (ref 275–301)
Sodium: 135 mmol/L — ABNORMAL LOW (ref 136–145)

## 2013-05-24 LAB — CULTURE, BLOOD (SINGLE)

## 2013-06-01 ENCOUNTER — Other Ambulatory Visit: Payer: Self-pay

## 2013-06-06 LAB — CULTURE, BLOOD (SINGLE)

## 2013-06-07 ENCOUNTER — Other Ambulatory Visit: Payer: Self-pay

## 2013-06-12 LAB — CULTURE, BLOOD (SINGLE)

## 2013-06-23 ENCOUNTER — Ambulatory Visit: Payer: Self-pay | Admitting: Otolaryngology

## 2013-07-14 ENCOUNTER — Ambulatory Visit: Payer: Self-pay | Admitting: Podiatry

## 2013-07-16 ENCOUNTER — Ambulatory Visit (INDEPENDENT_AMBULATORY_CARE_PROVIDER_SITE_OTHER): Payer: Medicare Other | Admitting: Cardiovascular Disease

## 2013-07-16 ENCOUNTER — Encounter: Payer: Self-pay | Admitting: Cardiovascular Disease

## 2013-07-16 VITALS — BP 132/68 | HR 47 | Ht 71.0 in | Wt 368.8 lb

## 2013-07-16 DIAGNOSIS — R600 Localized edema: Secondary | ICD-10-CM

## 2013-07-16 DIAGNOSIS — I509 Heart failure, unspecified: Secondary | ICD-10-CM

## 2013-07-16 DIAGNOSIS — I5032 Chronic diastolic (congestive) heart failure: Secondary | ICD-10-CM

## 2013-07-16 DIAGNOSIS — K7460 Unspecified cirrhosis of liver: Secondary | ICD-10-CM

## 2013-07-16 DIAGNOSIS — D649 Anemia, unspecified: Secondary | ICD-10-CM

## 2013-07-16 DIAGNOSIS — R609 Edema, unspecified: Secondary | ICD-10-CM

## 2013-07-16 NOTE — Progress Notes (Signed)
Patient ID: [name], male    DOB: [birth year], 63 y.o.   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 He presents for f/u.  He has a history of chronic anemia. Recently received packed red blood cells x2 in June 2014. history of varices and portal hypertension.   He follows up with Dr. Raford Pitcher at [facility] for his liver issues. MRI done in December 2010. He has been on Lasix for lower extremity edema 40 mg twice a day.   admitted to the hospital June 2012 for acute respiratory failure, started on BiPAP, also with hypotension, acute renal failure secondary to hypovolemia. He was in the ICU for several days on BiPAP and pressors. It was felt that he had bacteremia as he had chills and fever prior to presentation. Notes indicate etiology of his infection was uncertain. He was started on Zosyn for 7 days. Possibly related to his underlying liver disease (?SBP).  BUN 2 6 and creatinine 2.07 on arrival to the hospital. Creatinine on discharge was 1.0   admission June 26  for Pseudomonas septicemia, acute diastolic CHF. He had antibiotics, diuresis with improvement of his symptoms. Since discharge, he has been taking Lasix 40 mg twice a day. Report significant weight loss since his discharge.He had packed red blood cells x2.  In followup today, reports having episodes of encephalopathy. Over the Christmas was in the hospital with mental status changes. Started on lactulose. Hospital admission prior to that for staph infection Reports he has lost 30 pounds. Leg edema is better though he is uncertain why it has improved. He does have chronic fatigue. Otherwise feels well  Total cholesterol 181, LDL 118, hemoglobin A1c 5.7  Echocardiogram in hospital 01/02/2013 showed normal LV function, mildly dilated left atrium,  moderate elevated right ventricular systolic pressures estimated at 40 mm mercury   Prior Echocardiogram done in the hospital showed normal LV systolic function, normal biventricular systolic pressures no significant valvular pathology  EKG today shows normal sinus rhythm with rate 47 beats per minute, no significant ST or T wave changes      Outpatient Encounter Prescriptions as of 07/16/2013  Medication Sig  . furosemide (LASIX) 40 MG tablet Take 40 mg by mouth 2 (two) times daily.   Marland Kitchen lidocaine (XYLOCAINE) 2 % jelly Apply 1 application topically daily as needed.   . mometasone (NASONEX) 50 MCG/ACT nasal spray Place 2 sprays into the nose daily as needed.   . nadolol (CORGARD) 20 MG tablet Take 20 mg by mouth daily.    . NON FORMULARY CPAP at bedtime.  Marland Kitchen omeprazole (PRILOSEC) 20 MG capsule Take 40 mg by mouth daily.    Marland Kitchen PROCTOSOL HC 2.5 % rectal cream Place 1 application rectally 2 (two) times daily as needed.   Marland Kitchen RECTIV 0.4 % OINT as needed.   Marland Kitchen spironolactone (ALDACTONE) 50 MG tablet Take 100 mg by mouth daily.  . [DISCONTINUED] NIFEdipine POWD as needed.   . [DISCONTINUED] Tamsulosin HCl (FLOMAX) 0.4 MG CAPS Place 0.4 mg into alternate nostrils daily.      Review of Systems  Constitutional: Negative.   HENT: Negative.   Eyes: Negative.   Respiratory: Negative.   Cardiovascular: Positive for leg swelling.  Gastrointestinal: Negative.   Endocrine: Negative.   Musculoskeletal: Negative.   Skin: Negative.   Allergic/Immunologic: Negative.  Neurological: Negative.   Hematological: Negative.   Psychiatric/Behavioral: Negative.   All other systems reviewed and are negative.    BP 132/68  Pulse 47  Ht 5\' 11"  (1.803 m)  Wt 368 lb 12 oz (167.264 kg)  BMI 51.45 kg/m2  Physical Exam  Nursing note and vitals reviewed. Constitutional: He is oriented to person, place, and time. He appears well-developed and well-nourished.  HENT:  Head: Normocephalic.  Nose: Nose normal.   Mouth/Throat: Oropharynx is clear and moist.  Eyes: Conjunctivae are normal. Pupils are equal, round, and reactive to light.  Neck: Normal range of motion. Neck supple. No JVD present.  Cardiovascular: Normal rate, regular rhythm, S1 normal, S2 normal, normal heart sounds and intact distal pulses.  Exam reveals no gallop and no friction rub.   No murmur heard. Edema is nonpitting, compression hose in place  Pulmonary/Chest: Effort normal and breath sounds normal. No respiratory distress. He has no wheezes. He has no rales. He exhibits no tenderness.  Abdominal: Soft. Bowel sounds are normal. He exhibits no distension. There is no tenderness.  Musculoskeletal: Normal range of motion. He exhibits edema. He exhibits no tenderness.  Lymphadenopathy:    He has no cervical adenopathy.  Neurological: He is alert and oriented to person, place, and time. Coordination normal.  Skin: Skin is warm and dry. No rash noted. No erythema.  Psychiatric: He has a normal mood and affect. His behavior is normal. Judgment and thought content normal.      Assessment and Plan

## 2013-07-16 NOTE — Patient Instructions (Signed)
You are doing well. No medication changes were made.  Please call us if you have new issues that need to be addressed before your next appt.  Your physician wants you to follow-up in: 6 months.  You will receive a reminder letter in the mail two months in advance. If you don't receive a letter, please call our office to schedule the follow-up appointment.   

## 2013-07-16 NOTE — Assessment & Plan Note (Signed)
Appears relatively euvolemic on today's visit. We'll continue current diuretic regimen

## 2013-07-16 NOTE — Assessment & Plan Note (Signed)
Managing well now with lactulose daily

## 2013-07-16 NOTE — Assessment & Plan Note (Signed)
Hematocrit stable at 30

## 2013-07-16 NOTE — Assessment & Plan Note (Signed)
He reports recent weight loss. We have encouraged continued exercise, careful diet management in an effort to lose weight.

## 2013-07-16 NOTE — Assessment & Plan Note (Signed)
Leg edema improved. Wear compression hose. Would continue diuretics

## 2013-07-21 ENCOUNTER — Ambulatory Visit (INDEPENDENT_AMBULATORY_CARE_PROVIDER_SITE_OTHER): Payer: Medicare Other | Admitting: Podiatry

## 2013-07-21 ENCOUNTER — Ambulatory Visit (INDEPENDENT_AMBULATORY_CARE_PROVIDER_SITE_OTHER): Payer: Medicare Other

## 2013-07-21 ENCOUNTER — Encounter: Payer: Self-pay | Admitting: Podiatry

## 2013-07-21 VITALS — BP 151/64 | HR 60 | Resp 16 | Ht 71.0 in | Wt 366.8 lb

## 2013-07-21 DIAGNOSIS — M7750 Other enthesopathy of unspecified foot: Secondary | ICD-10-CM

## 2013-07-21 DIAGNOSIS — M778 Other enthesopathies, not elsewhere classified: Secondary | ICD-10-CM

## 2013-07-21 DIAGNOSIS — M79673 Pain in unspecified foot: Secondary | ICD-10-CM

## 2013-07-21 DIAGNOSIS — M79609 Pain in unspecified limb: Secondary | ICD-10-CM

## 2013-07-21 DIAGNOSIS — M779 Enthesopathy, unspecified: Secondary | ICD-10-CM

## 2013-07-21 NOTE — Progress Notes (Signed)
   Subjective:    Patient ID: [name], male    DOB: 1951-06-09, 63 y.o.   MRN: [id number]  HPI Comments: i got a bunion on the side of my right foot, right at the little toe. Its been bothering me for about 6 weeks. i use moleskin on the side of my foot and try to stay off concrete and stuff like that.  Foot Pain      Review of Systems  Constitutional: Positive for unexpected weight change.  HENT: Negative.   Eyes: Negative.   Respiratory: Positive for shortness of breath.   Cardiovascular: Positive for leg swelling.  Gastrointestinal:       Liver disease  Endocrine: Positive for cold intolerance.       Increase urination  Genitourinary: Negative.   Musculoskeletal: Negative.   Skin: Positive for color change.  Allergic/Immunologic: Negative.   Neurological:       Cramps  Hematological: Bruises/bleeds easily.  Psychiatric/Behavioral: Negative.        Objective:   Physical Exam: I have reviewed his past medical history medications allergies surgeries social history and review of systems. Pulses are palpable right lower extremity. Considerable edema about the bilateral lower extremities secondary to liver failure. There is pain on palpation sub-fifth metatarsal head of the right foot secondary to overlying pressure. There is no open skin and there is a palpable bursa beneath the head of the fifth metatarsal.        Assessment & Plan:  Assessment: Bursitis capsulitis fifth metatarsophalangeal joint of the right foot.  Plan: Injected with dexamethasone and local anesthetic today we'll followup with him on an as-needed basis.

## 2014-07-18 ENCOUNTER — Inpatient Hospital Stay: Payer: Self-pay | Admitting: Internal Medicine

## 2014-07-18 LAB — CBC
HCT: 31.7 % — ABNORMAL LOW (ref 40.0–52.0)
HGB: 10.1 g/dL — ABNORMAL LOW (ref 13.0–18.0)
MCH: 25.6 pg — AB (ref 26.0–34.0)
MCHC: 31.8 g/dL — AB (ref 32.0–36.0)
MCV: 80 fL (ref 80–100)
Platelet: 60 10*3/uL — ABNORMAL LOW (ref 150–440)
RBC: 3.94 10*6/uL — ABNORMAL LOW (ref 4.40–5.90)
RDW: 20.7 % — AB (ref 11.5–14.5)
WBC: 16.1 10*3/uL — AB (ref 3.8–10.6)

## 2014-07-18 LAB — COMPREHENSIVE METABOLIC PANEL
ALBUMIN: 3.4 g/dL (ref 3.4–5.0)
AST: 49 U/L — AB (ref 15–37)
Alkaline Phosphatase: 96 U/L
Anion Gap: 9 (ref 7–16)
BILIRUBIN TOTAL: 5.7 mg/dL — AB (ref 0.2–1.0)
BUN: 22 mg/dL — ABNORMAL HIGH (ref 7–18)
CHLORIDE: 105 mmol/L (ref 98–107)
Calcium, Total: 8.5 mg/dL (ref 8.5–10.1)
Co2: 23 mmol/L (ref 21–32)
Creatinine: 1.67 mg/dL — ABNORMAL HIGH (ref 0.60–1.30)
EGFR (African American): 54 — ABNORMAL LOW
EGFR (Non-African Amer.): 44 — ABNORMAL LOW
Glucose: 128 mg/dL — ABNORMAL HIGH (ref 65–99)
Osmolality: 279 (ref 275–301)
POTASSIUM: 4.3 mmol/L (ref 3.5–5.1)
SGPT (ALT): 36 U/L
Sodium: 137 mmol/L (ref 136–145)
Total Protein: 7.5 g/dL (ref 6.4–8.2)

## 2014-07-18 LAB — URINALYSIS, COMPLETE
Bilirubin,UR: NEGATIVE
Blood: NEGATIVE
Glucose,UR: NEGATIVE mg/dL (ref 0–75)
Hyaline Cast: 3
Ketone: NEGATIVE
Leukocyte Esterase: NEGATIVE
Nitrite: NEGATIVE
PH: 5 (ref 4.5–8.0)
PROTEIN: NEGATIVE
RBC,UR: <1 /HPF (ref 0–5)
SPECIFIC GRAVITY: 1.016 (ref 1.003–1.030)
Squamous Epithelial: 1
WBC UR: 1 /HPF (ref 0–5)

## 2014-07-18 LAB — AMMONIA: Ammonia, Plasma: 65 mcmol/L — ABNORMAL HIGH (ref 11–32)

## 2014-07-18 LAB — TROPONIN I: TROPONIN-I: 0.04 ng/mL

## 2014-07-19 LAB — COMPREHENSIVE METABOLIC PANEL
Albumin: 2.8 g/dL — ABNORMAL LOW (ref 3.4–5.0)
Alkaline Phosphatase: 73 U/L
Anion Gap: 7 (ref 7–16)
BUN: 24 mg/dL — ABNORMAL HIGH (ref 7–18)
Bilirubin,Total: 5.5 mg/dL — ABNORMAL HIGH (ref 0.2–1.0)
CREATININE: 1.67 mg/dL — AB (ref 0.60–1.30)
Calcium, Total: 7.9 mg/dL — ABNORMAL LOW (ref 8.5–10.1)
Chloride: 106 mmol/L (ref 98–107)
Co2: 24 mmol/L (ref 21–32)
GFR CALC AF AMER: 54 — AB
GFR CALC NON AF AMER: 44 — AB
GLUCOSE: 105 mg/dL — AB (ref 65–99)
Osmolality: 278 (ref 275–301)
POTASSIUM: 3.6 mmol/L (ref 3.5–5.1)
SGOT(AST): 38 U/L — ABNORMAL HIGH (ref 15–37)
SGPT (ALT): 26 U/L
SODIUM: 137 mmol/L (ref 136–145)
Total Protein: 6.1 g/dL — ABNORMAL LOW (ref 6.4–8.2)

## 2014-07-19 LAB — CBC WITH DIFFERENTIAL/PLATELET
BASOS ABS: 0 10*3/uL (ref 0.0–0.1)
Basophil %: 0.4 %
EOS ABS: 0.1 10*3/uL (ref 0.0–0.7)
Eosinophil %: 1.3 %
HCT: 26.4 % — ABNORMAL LOW (ref 40.0–52.0)
HGB: 8.4 g/dL — ABNORMAL LOW (ref 13.0–18.0)
LYMPHS ABS: 0.6 10*3/uL — AB (ref 1.0–3.6)
Lymphocyte %: 8 %
MCH: 25.3 pg — ABNORMAL LOW (ref 26.0–34.0)
MCHC: 31.9 g/dL — ABNORMAL LOW (ref 32.0–36.0)
MCV: 80 fL (ref 80–100)
Monocyte #: 0.9 x10 3/mm (ref 0.2–1.0)
Monocyte %: 12 %
Neutrophil #: 5.8 10*3/uL (ref 1.4–6.5)
Neutrophil %: 78.3 %
PLATELETS: 44 10*3/uL — AB (ref 150–440)
RBC: 3.32 10*6/uL — ABNORMAL LOW (ref 4.40–5.90)
RDW: 21.2 % — AB (ref 11.5–14.5)
WBC: 7.4 10*3/uL (ref 3.8–10.6)

## 2014-07-19 LAB — MAGNESIUM: Magnesium: 2.3 mg/dL

## 2014-07-19 LAB — URINE CULTURE

## 2014-07-19 LAB — AMMONIA: AMMONIA, PLASMA: 70 umol/L — AB (ref 11–32)

## 2014-07-19 LAB — PROTIME-INR
INR: 2.3
Prothrombin Time: 24.9 secs — ABNORMAL HIGH (ref 11.5–14.7)

## 2014-07-20 LAB — CREATININE, SERUM
Creatinine: 1.47 mg/dL — ABNORMAL HIGH
EGFR (African American): >60
EGFR (Non-African Amer.): 51 — ABNORMAL LOW

## 2014-07-20 LAB — AMMONIA: AMMONIA, PLASMA: 84 umol/L — AB (ref 11–32)

## 2014-07-23 LAB — CULTURE, BLOOD (SINGLE)

## 2014-10-28 NOTE — Consult Note (Signed)
PATIENT NAME:  [name], [name] MR#:  [id number] DATE OF BIRTH:  1951-05-25  DATE OF CONSULTATION:  05/21/2013.  REFERRING PHYSICIAN:   Orville Govern, MD  ATTENDING GASTROENTEROLOGIST:  Trixie Deis, MD CONSULTING PHYSICIAN:   Hardie Shackleton. Laticha Ferrucci, PA-C  REASON FOR CONSULTATION:  A history of Elita Boone cirrhosis with anemia and heme-positive stool.   HISTORY OF PRESENT ILLNESS:  This is a pleasant 64 year old morbidly obese gentleman with a past medical history significant for Elita Boone cirrhosis, who has been hospitalized since 05/17/2013. He was having some shortness of breath and was later found to be septic with blood cultures notable for gram-positive cocci noting a staph organism. He has been responding well to antibiotics, and actually he has no current symptomatic concerns or complaints other than chronic fatigue. Of note, since his admission, his hemoglobin has steadily been declining from the initial read of 9.2 and yesterday he had dropped down to 7.5. Today he actually came up a little bit at 7.9. MCV is 82. When his stool was guaiaced, this did turn up positive. He does have a known history of esophageal varices secondary to [place] cirrhosis and portal hypertension. His last EGD on record was 01/29/2010 by Dr. Lynnae Prude. This is notable for grade II esophageal varices. The last colonoscopy was 09/23/2007, by Dr. Mechele Collin notable for diverticulosis and internal hemorrhoids. At initial presentation to the hospital, he was having some altered mental status, and it is suspected he may have been in hepatic encephalopathy. He has been on lactulose and his mental status has returned to baseline. He denies noticing any overt GI bleeding. No dysphagia, nausea, vomiting or trouble with his appetite. No abdominal pain. No black or bloody stools that he has been able to notice. He is having about 2 to 3 bowel movements per day, which can be expected with lactulose use. No other current symptomatic concerns  or complaints. He did have a PICC line started earlier today so that he can continue to receive IV antibiotics for his recent bacteremia finding.   PAST MEDICAL HISTORY: 1.  Elita Boone cirrhosis with esophageal varices.  2.  Obstructive sleep apnea.  3.  Morbid obesity.  4.  GERD.  5.  Hypertension.   SOCIAL HISTORY:  Remote tobacco use. No current alcohol, tobacco or illicit drug use.   FAMILY HISTORY:  No known family history of GI malignancy, colon polyps, IBD or liver disease.   HOME MEDICATIONS: 1.  Lasix.  2.  Nadolol.  3.  Nasonex.  4.  Prilosec. 5.  Spironolactone.   ALLERGIES:  No known drug allergies.   REVIEW OF SYSTEMS:  Ten-system review of systems was obtained on the patient. The pertinent positives are mentioned above and otherwise negative.   OBJECTIVE:  VITAL SIGNS:  Blood pressure 129/69, heart rate 52, respirations 20, temperature 98 degrees, bedside pulse ox 96% on room air.  GENERAL:  This is a pleasant 64 year old, morbidly obese gentleman, resting quietly and comfortably in bed, accompanied by his wife at bedside, in no acute distress. Alert and oriented x 3.  HEAD:  Atraumatic, normocephalic.  NECK:  Supple. No lymphadenopathy noted.  HEENT:  Sclerae anicteric. Mucous membranes moist.  LUNGS:  Respirations are even and unlabored. Clear to auscultation bilateral anterior lung fields.  CARDIAC:  Regular rate and rhythm. S1, S2 noted.  ABDOMEN:  Soft, nontender, nondistended. Normoactive bowel sounds noted in all 4 quadrants. No guarding or rebound. No masses palpated. No organomegaly appreciated. However, exam is significantly limited secondary to  morbidly obese habitus.  RECTAL:  Deferred.  PSYCHIATRIC:  Appropriate mood and affect.  NEUROLOGIC:  Cranial nerves II through XII are grossly intact.  EXTREMITIES:  Negative for lower extremity edema, 2+ pulses noted in bilateral upper extremities.   LABORATORY DATA:  White blood cells 3.3, hemoglobin 7.9, hematocrit  23.7, platelets 42, MCV 82. INR 2.0, PT 22.7, sodium 137, potassium 4, BUN 21, creatinine 1.24, glucose 105, magnesium 2.1. Sedimentation rate is 35. Heme-positive stool.   IMAGING:  Ultrasound of the abdomen was obtained on the patient showing an echodense liver suggesting cirrhosis and chronic liver disease. Splenomegaly was noted at 15 cm. Negative for gallstones.   CT scan of the abdomen and chest was obtained on the patient showing small pulmonary nodules, evidence of cirrhosis, portal hypertension, splenomegaly and esophageal varices.   Chest x-ray was obtained on the patient showing cardiomegaly with vascular congestion suggesting early interstitial edema.   ASSESSMENT:  1.  Elita Booneash cirrhosis with known esophageal varices. Also portal hypertension and thrombocytopenia.  2.  Anemia of chronic disease, likely secondary to his liver disease.  3.  Heme-positive stool.  4.  Bacteremia. Positive for gram-positive cocci in his blood culture. This was consistent with a staph organism. Infectious Disease has been following, and he did have a PICC line placed today so he can continue to receive IV antibiotics after discharge.  5.  Morbid obesity.  6.  Sleep apnea, currently utilizing CPAP.   PLAN:  I have discussed this patient's case in detail with Dr. Trixie DeisMatthew Ryan, who is involved in the development of the patient's plan of care. The patient does have known Elita Booneash cirrhosis with esophageal varices and portal hypertension. His hemoglobin has gradually been trending down over the past few days, although it came up slightly today compared to yesterday. MCV is 82. It is unclear to decipher whether or not this is anemia of chronic disease or if there is an actual overt gastrointestinal bleed going on. He does have known internal hemorrhoids, which could certainly be the explanation for the heme-positive stools. The last esophagogastroduodenoscopy was 01/29/2010 by Dr. Lynnae Prudeobert Elliott, notable for grade II  esophageal varices. He was recommended to repeat that exam back in 2013, but unfortunately this was not completed. It does appear that Renal discontinued albumin and IV fluids today. [facility] where he received Aldactone, nadolol and Lasix as an outpatient. We do agree with continuing IV antibiotics as an outpatient for his bacteremia. I will obtain some iron studies to further investigate his anemia to identify the etiology. I will also discuss with Dr. Alycia Rossettiyan the role for a potential endoscopy if clinically warranted. We will continue to monitor this patient throughout hospitalization and make further recommendations per clinical course. He likely will need a repeat esophagogastroduodenoscopy inpatient versus outpatient. All questions were answered.   Thank you so much for this consultation and for allowing us to participate in this patient's plan of care.   ____________________________ Hardie ShackletonKaryn M. Tyreke Kaeser, PA-C [email or web address] D: 05/21/2013 14:50:07 ET T: 05/21/2013 15:15:37 ET JOB#: [id number]  cc: Hardie ShackletonKaryn M. Tyanna Hach, PA-C, <Dictator> Hardie ShackletonKARYN M Lynnet Hefley PA ELECTRONICALLY SIGNED 05/24/2013 13:10

## 2014-10-28 NOTE — Discharge Summary (Signed)
PATIENT NAME:  [name], JEAN MR#:  [id number] DATE OF BIRTH:  02/02/1951  DATE OF ADMISSION:  12/31/2012 DATE OF DISCHARGE:  01/03/2013  PRESENTING COMPLAINT: Shortness of breath and fever.   DISCHARGE DIAGNOSES: 1.  Pseudomonas septicemia, source unclear.  2.  Acute diastolic congestive heart failure.  3.  Chronic bilateral lower extremity lymphedema.  4.  Chronic kidney disease, stage III.  5.  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  6.  Chronic anemia.  7.  Hypertension.  8.  Morbid obesity.   CODE STATUS: FULL CODE.   SATURATION:  Greater than 92% on room air.   DISCHARGE MEDICATIONS: 1.  Nadolol 20 mg at bedtime.  2.  Omeprazole 1 tablet b.i.d.  3.  Nasonex 50 mcg per spray nasally daily.  4.  Spironolactone 50 mg daily.  5.  Lasix 40 mg daily.  6.  Cefuroxime 500 mg b.i.d. for 8 more days.   DISCHARGE INSTRUCTIONS:   1.  Follow up with your hepatologist at [facility] on Monday, June 30th.  2.  Follow up with your PCP in 1 to 2 weeks.  3.  Follow up with Dr. Mariah Milling in 1 week.   LABORATORY AND DIAGNOSTICS: Glucose is 89, BUN is 30, creatinine 1.42, sodium is 134, potassium 3.7, chloride 102 and bicarb is 25.  Hemoglobin is 7.3. Magnesium is 2.1.   VQ scan:  Low probability for PE, consistent with CHF.    Echo Doppler showed EF of 50% to 55%, normal global left ventricular systolic function, moderately increased left ventricular internal cavity size, mild enlargement and mildly dilated left atrium and right atrium. Mild TR.   Ultrasound bilateral lower extremities:  Negative for DVT.   Chest x-ray consistent with cardiomegaly and pulmonary edema.  White count is 10.6. Platelet count is 54.   Blood cultures positive for Pseudomonas aeruginosa.  Cardiac enzymes are negative x 3. B-type natriuretic peptide was 597. PT-INR were elevated at 19.5 and 1.7.   BRIEF SUMMARY OF HOSPITAL COURSE:  [name] is a very pleasant 64 year old morbidly obese Caucasian  gentleman with history of cirrhosis of liver due to NASH, sleep apnea and morbid obesity who comes in with weakness, fever and shortness of breath. The patient was admitted with:  1.  Acute respiratory failure secondary to pulmonary edema and acute diastolic congestive heart failure. Received IV Lasix and his urine output improved. His creatinine improved also, from 1.75 and came down to 1.4.  2.  Fever which shortness of breath.  The patient was started on broad-spectrum antibiotics with Rocephin and Zithromax thinking likely is pneumonia; however, the patient did not have clinical signs or symptoms of cough and chest x-ray was more consistent with congestive heart failure. The patient did not have any fever during hospitalization. His white count remained stable. His blood cultures however grew Pseudomonas aeruginosa, so the patient was continued on Rocephin. He remained afebrile and was changed to p.o. cefuroxime and will complete a total 10 day course. Repeat blood cultures prior to discharge were negative in 24 hours.  3.  Anemia of chronic disease. Suspect due to cirrhosis of liver with pancytopenia. The patient's hemoglobin was 7.5. Stool occult was negative. Some could be dilutional secondary to his volume overload. He was advised not to take any iron pills by his hepatologist at [facility]. No active bleeding was noted.  Transfusion was not indicated.  4.  Sleep apnea. Continued CPAP at night.  5.  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  Remained  stable.  6.  Nonalcoholic hepatic steatosis. Continue Lasix, spironolactone and nadolol. The patient will follow up with hepatologist as outpatient.   Hospital stay otherwise remained stable. The patient remained a FULL CODE.  TIME SPENT: 40 minutes.  ____________________________ Wylie HailSona A. Allena KatzPatel, MD sap:sb D: 01/04/2013 07:12:52 ET T: 01/04/2013 07:40:02 ET JOB#: [id number]  cc: Keziyah Kneale A. Allena KatzPatel, MD, <Dictator> Antonieta Ibaimothy J. Gollan, MD [facility]  Willow OraSONA A Lesleyanne Politte MD ELECTRONICALLY SIGNED 01/21/2013 7:31

## 2014-10-28 NOTE — H&P (Signed)
PATIENT NAME:  [name], [name] MR#:  [id number] DATE OF BIRTH:  06-09-1951  DATE OF ADMISSION:  05/17/2013  REFERRING PHYSICIAN: Dr. Corky Downs.   PRIMARY CARE PHYSICIAN: Dr. Ellison Hughs.    CHIEF COMPLAINT: Shortness of breath.   HISTORY OF PRESENT ILLNESS: This is a 64 year old Caucasian gentleman with past medical history of gastroesophageal reflux disease as well as cirrhosis secondary to NASH and known esophageal varices who is presenting with shortness of breath. He has been having a 1 day's duration of symptoms described as shortness of breath with associated fevers and subjective chills as well as increasing lower extremity edema; however, he denies any cough, sick contacts, chest pain, palpitations, orthopnea or PND. He is able to lie flat while sleeping. On EMS arrival, the patient was found to be saturating 80% and in moderate respiratory distress. He was also noted to be febrile on arrival to [facility]. He was placed on BiPAP therapy with improvement of symptoms and oxygenation. He received a dose of Lasix in the Emergency Department. Now relatively hypotensive when compared to arrival. As far as his lower extremity edema is concerned, this is chronic in nature and has been gradually worsening since approximately August. He has apparently been getting Lasix and receiving periodic albumin infusions with minimal change in his symptoms.   REVIEW OF SYSTEMS: Unable to obtain secondary to the patient's current mental status and medical condition.   PAST MEDICAL HISTORY: Gastroesophageal reflux disease, hypertension, cirrhosis secondary to NASH, known esophageal varices, obstructive sleep apnea on home CPAP setting of 20.   SOCIAL HISTORY: Ex-tobacco use. Denies any alcohol or drug usage. Lives at home with wife.   FAMILY HISTORY: Positive for diabetes and coronary artery disease.   ALLERGIES: No known drug allergies.   HOME MEDICATIONS: Lasix 20 mg 3 tabs p.o. b.i.d., nadolol 20 mg  p.o. daily, Nasonex 50 mcg inhalation 1 spray daily, Prilosec 20 mg p.o. b.i.d., spironolactone 50 mg 4 tablets p.o. daily.   PHYSICAL EXAMINATION:  VITAL SIGNS: Temperature 103.2 degrees Fahrenheit, pulse 72, respirations 38, blood pressure initially 153/86, currently 90s/30s, originally saturating 88% on arrival, now saturating 100% on BiPAP therapy.  GENERAL: Obese Caucasian gentleman who is in moderate distress, requiring BiPAP therapy.  HEAD: Normocephalic, atraumatic.  EYES: Pupils equal, round, reactive to light. Extraocular muscles intact. Trace scleral icterus noted.  ENT: Unable to assess throat given BiPAP in place; however, no external lesions present.  NECK: Supple. No thyromegaly. No nodules. No JVD.  PULMONARY: Diminished breath sounds throughout secondary to body habitus with scant rhonchi in left lower. No use of accessory muscles. Currently on BiPAP therapy.  CHEST: Nontender to palpation.  CARDIOVASCULAR: S1, S2, with a 3/6 systolic ejection murmur. There is pedal edema bilaterally to the thighs which is 2+. Pedal pulses 2+ bilaterally.  GASTROINTESTINAL: Soft, nontender, nondistended. No masses. Positive bowel sounds. Obese. No hepatosplenomegaly.  MUSCULOSKELETAL: No swelling or clubbing. Edema as described above. Range of motion is full in all extremities.  NEUROLOGIC: Cranial nerves II through XII are intact. Sensation intact. Reflexes intact. The patient does have flapping tremor bilaterally.  SKIN: No ulcerations, lesions, rashes or cyanosis; however, does appear jaundiced. Skin is warm and dry. Turgor intact.  PSYCHIATRIC: The patient is somnolent. Will arouse with verbal stimuli, though falls back asleep. Once again arousable. Insight and judgment at this time would be poor with current mental status and medical condition.   LABORATORY DATA: Sodium 136, potassium 4.3, chloride 104, bicarb 26, BUN 20, creatinine 1.58,  glucose 113, BNP 715. Ammonia 133. Total protein 7.3,  albumin 3.6, bilirubin 3.5, alk phos 102, AST 57, ALT 32. Troponin I 0.03. WBC 9.5, hemoglobin 9.2, platelets 63. Urinalysis negative for evidence of infection. ABG performed on BiPAP therapy of 15 and 5 is 7.52/32/478/26.1. Chest x-ray reveals cardiomegaly, bilateral basilar atelectasis with trace effusion most prominent on the left, though image is limited by body habitus. EKG performed revealing normal sinus rhythm, heart rate 78.   ASSESSMENT AND PLAN: A 64 year old Caucasian gentleman with history of nonalcoholic steatohepatitis and cirrhosis, presenting with shortness of breath. EMS arrival found him to be saturating 80%. He is also noted to be febrile.  1. Sepsis given fever and respiratory rate: Possible pulmonary etiology. Will panculture including blood and sputum. Antibiotics given already with vancomycin and Zosyn. Will continue vancomycin and cefepime given history of pseudomonas septicemia. Intravenous fluids to keep mean arterial pressure greater than 65. May require pressor therapy.  2. Acute respiratory failure: Will continue BiPAP and wean as tolerated. Continue supplemental oxygen to keep oxygen saturation greater than 92%. Provide DuoNeb therapy q.4 hours.  3. Hepatic encephalopathy: Will start lactulose and follow mental status.  4. Cirrhosis secondary to nonalcoholic steatohepatitis: Will hold nadolol, Lasix and spironolactone given hypotension. Albumin is actually within normal limits. Does not require any transfusions of albumin at this time.  5. Gastroesophageal reflux disease: Idiopathic.  6. Thrombocytopenia: No indication for transfusion at this time. Goal is greater than 20,000 if no bleeding.   The patient is FULL CODE.   CRITICAL CARE TIME SPENT: 45 minutes.    ____________________________ Gavin Mose. Hower, MD dkh:gb D: 05/17/2013 22:54:12 ET T: 05/17/2013 23:08:42 ET JOB#: [id number]  cc: Gavin Mose. Hower, MD, <Dictator> DAVID Woodfin Ganja MD ELECTRONICALLY SIGNED 05/18/2013  20:24

## 2014-10-28 NOTE — Consult Note (Signed)
Brief Consult Note: Diagnosis: anemia, heme + stool, NASH.   Discussed with Attending MD.   Comments: Patient seen and examined. No overt GI bleeding but pt was found to be heme+. Hgb has been steadily declining until today. Yesterday hgb 7.5, today 7.9. MCV is 82. May be chronic disease related, though the pt has a known history of portal HTN and esophageal varices.  Last EGD on record was 2011 positive for varices. last colon 2009 and was negative for polyps.  VSS. No current complaints, tolerating diet. some chronic fatigue.  Recc serial h&h, transfuse PRN. will discuss with Dr Shelle Ironein possible need for endoscopy. will also check iron studies. full consult being dictated.  Electronic Signatures: Ashok Cordiaarle, Aryiah Monterosso M (PA-C)  (Signed [phone]-Nov-14 14:01)  Authored: Brief Consult Note   Last Updated: 14-Nov-14 14:01 by Ashok CordiaEarle, Sarah Zerby M (PA-C)

## 2014-10-28 NOTE — H&P (Signed)
PATIENT NAME:  [name], [name] MR#:  [id number] DATE OF BIRTH:  [birth year]  DATE OF ADMISSION:  12/31/2012  Addendum   The patient does have elevated d-dimer of 1.44.  This could be secondary to his pneumonia, but patient does not have any significant wheezing on exam or findings on clinical examination.  We will get a V/Q scan, also get lower extremity Dopplers.  Further management as per test results.    ____________________________ Molinda BailiffSrikar R. Jeydi Klingel, MD srs:ea D: 12/31/2012 23:18:45 ET T: 01/01/2013 00:13:32 ET JOB#: [id number]  cc: Wardell HeathSrikar R. Maretta Overdorf, MD, <Dictator> Orie FishermanSRIKAR R Kaelan Emami MD ELECTRONICALLY SIGNED 01/01/2013 15:52

## 2014-10-28 NOTE — Consult Note (Signed)
General Aspect [name] is a middle aged male with non alcoholic liver disease who is admitted with fever / sepsis syndrome / and hypotension.  he has been seen by Dr. Rockey Situ for diastolic dysfunction.   Present Illness Pt has had gradually declining health for the past 2 years due to liver failure.  he has had diastolic dysfunction.  he is typically treated at [facility] for his liver disease.  According to his wife, he typically requires Albumin infusions in order to achieve diuresis.    he denies any chest pain or worsening dyspnea.  we were asked to see for assistance in managing his hypotension.   His BP is better this am after receiving some IVF.   Physical Exam:  GEN well developed, obese, critically ill appearing   HEENT pink conjunctivae, moist oral mucosa   NECK supple  neck is very thick, could not assess JVD   RESP normal resp effort  clear anteriorly   CARD Regular rate and rhythm  distant heart sounds   ABD denies tenderness  distended  he is obese but his abdomen is also quite distended   EXTR positive edema, he has 3-4+ edema, + chronic stasis changes   NEURO cranial nerves intact   PSYCH alert   Review of Systems:  General: Weight loss or gain  Fever/chills  hef has progressively gained weight recently   ENT: No Complaints   Neck: No Complaints   Vascular: No Complaints   Hematologic: No Complaints   Review of Systems: All other systems were reviewed and found to be negative     GERD - Esophageal Reflux:    Hypertension:    Morbid Obesity:    Morbid Obseity:    Liver Disease:    Carpal Tunnel Release: RIGHT hand, 2009   Hemorrhoidectomy: 29-May-2012   hernia repair:   Home Medications: Medication Instructions Status  nadolol 20 mg oral tablet 1 tab(s) orally once a day Active  Nasonex 50 mcg/inh nasal spray 1 spray(s) nasal once a day Active  omeprazole 20 mg oral delayed release capsule 1 cap(s) orally 2 times a day Active  furosemide 20  mg oral tablet 3 tab(s) orally 2 times a day Active  spironolactone 50 mg oral tablet 4 tab(s) orally once a day Active   Lab Results: Hepatic:  10-Nov-14 20:18   Bilirubin, Total  3.5  Alkaline Phosphatase 102  SGPT (ALT) 32  SGOT (AST)  57  Total Protein, Serum 7.6  Albumin, Serum 3.6  11-Nov-14 04:20   Bilirubin, Total  4.1  Alkaline Phosphatase 83  SGPT (ALT) 26  SGOT (AST)  48  Total Protein, Serum 6.5  Albumin, Serum  3.1  Routine Micro:  10-Nov-14 20:31   Micro Text Report BLOOD CULTURE   COMMENT                   NO GROWTH IN 8-12 HOURS   ANTIBIOTIC                       Culture Comment NO GROWTH IN 8-12 HOURS  Result(s) reported on 18 May 2013 at 04:00AM.    20:34   Micro Text Report BLOOD CULTURE   COMMENT                   NO GROWTH IN 8-12 HOURS   ANTIBIOTIC  Culture Comment NO GROWTH IN 8-12 HOURS  Result(s) reported on 18 May 2013 at 04:00AM.  Lab:  10-Nov-14 20:24   pH (ABG)  7.52  PCO2  32  PO2  478  FiO2 100  Base Excess  3.7  HCO3  26.1  O2 Saturation 99.2  O2 Device BIPAP  Specimen Site (ABG) RT RADIAL  Specimen Type (ABG) ARTERIAL  Patient Temp (ABG) 37.0  PSV 15  CPAP 5.0  Mechanical Rate 12  Routine Chem:  10-Nov-14 20:18   Glucose, Serum  113  BUN  20  Creatinine (comp)  1.58  Sodium, Serum 136  Potassium, Serum 4.3  Chloride, Serum 104  CO2, Serum 26  Calcium (Total), Serum 8.9  Osmolality (calc) 275  eGFR (African American)  54  eGFR (Non-African American)  46 (eGFR values <78mL/min/1.73 m2 may be an indication of chronic kidney disease (CKD). Calculated eGFR is useful in patients with stable renal function. The eGFR calculation will not be reliable in acutely ill patients when serum creatinine is changing rapidly. It is not useful in  patients on dialysis. The eGFR calculation may not be applicable to patients at the low and high extremes of body sizes, pregnant women, and vegetarians.)  Anion Gap   6  B-Type Natriuretic Peptide ([facility])  715 (Result(s) reported on 17 May 2013 at 08:49PM.)    20:24   Result Comment - READ-BACK PROCESS PERFORMED.  - HAND DELIVERED  - Dr. Corky Downs @ 2030 05/17/2013 jcg  Result(s) reported on 17 May 2013 at 08:29PM.    20:31   Ammonia, Plasma  133 (Result(s) reported on 17 May 2013 at 09:29PM.)  11-Nov-14 04:20   Glucose, Serum  109  BUN  25  Creatinine (comp)  1.78  Sodium, Serum 136  Potassium, Serum 3.7  Chloride, Serum 104  CO2, Serum 26  Calcium (Total), Serum 8.6  Osmolality (calc) 277  eGFR (African American)  46  eGFR (Non-African American)  40 (eGFR values <109mL/min/1.73 m2 may be an indication of chronic kidney disease (CKD). Calculated eGFR is useful in patients with stable renal function. The eGFR calculation will not be reliable in acutely ill patients when serum creatinine is changing rapidly. It is not useful in  patients on dialysis. The eGFR calculation may not be applicable to patients at the low and high extremes of body sizes, pregnant women, and vegetarians.)  Anion Gap  6  Cardiac:  10-Nov-14 20:18   Troponin I 0.03 (0.00-0.05 0.05 ng/mL or less: NEGATIVE  Repeat testing in 3-6 hrs  if clinically indicated. >0.05 ng/mL: POTENTIAL  MYOCARDIAL INJURY. Repeat  testing in 3-6 hrs if  clinically indicated. NOTE: An increase or decrease  of 30% or more on serial  testing suggests a  clinically important change)  Routine UA:  10-Nov-14 21:07   Color (UA) Yellow  Clarity (UA) Clear  Glucose (UA) Negative  Bilirubin (UA) Negative  Ketones (UA) Negative  Specific Gravity (UA) 1.010  Blood (UA) Negative  pH (UA) 5.0  Protein (UA) Negative  Nitrite (UA) Negative  Leukocyte Esterase (UA) Negative (Result(s) reported on 17 May 2013 at 09:45PM.)  RBC (UA) NONE SEEN  WBC (UA) <1 /HPF  Bacteria (UA) NONE SEEN  Epithelial Cells (UA) NONE SEEN  Result(s) reported on 17 May 2013 at 09:45PM.  Routine Hem:  10-Nov-14 20:18    WBC (CBC) 9.5  RBC (CBC)  3.35  Hemoglobin (CBC)  9.2  Hematocrit (CBC)  27.4  Platelet Count (CBC)  63 (Result(s)  reported on 17 May 2013 at 08:46PM.)  MCV 82  MCH 27.5  MCHC 33.6  RDW  20.5    No Known Allergies:   Vital Signs/Nurse's Notes: **Vital Signs.:   11-Nov-14 02:30  Pulse Pulse 60  Respirations Respirations 42  Systolic BP Systolic BP 89  Diastolic BP (mmHg) Diastolic BP (mmHg) 38  Mean BP 55  Pulse Ox % Pulse Ox % 98  Pulse Ox Heart Rate 62    03:29  Pulse Pulse 58  Respirations Respirations 38  Systolic BP Systolic BP 419  Diastolic BP (mmHg) Diastolic BP (mmHg) 41  Mean BP 61  Pulse Ox % Pulse Ox % 98  Pulse Ox Activity Level  At rest  Oxygen Delivery 4L  Pulse Ox Heart Rate 60    04:29  Pulse Pulse 54  Respirations Respirations 36  Systolic BP Systolic BP 622  Diastolic BP (mmHg) Diastolic BP (mmHg) 34  Mean BP 58  Pulse Ox % Pulse Ox % 98  Pulse Ox Activity Level  At rest  Oxygen Delivery 4L  Pulse Ox Heart Rate 54    05:29  Temperature Temperature (F) 98.9  Celsius 37.1  Temperature Source oral  Pulse Pulse 54  Respirations Respirations 42  Systolic BP Systolic BP 297  Diastolic BP (mmHg) Diastolic BP (mmHg) 27  Mean BP 55  Pulse Ox % Pulse Ox % 99  Pulse Ox Heart Rate 56    06:28  Pulse Pulse 56  Respirations Respirations 30  Systolic BP Systolic BP 88  Diastolic BP (mmHg) Diastolic BP (mmHg) 40  Mean BP 56  Pulse Ox % Pulse Ox % 98  Pulse Ox Activity Level  At rest  Oxygen Delivery 4L  Pulse Ox Heart Rate 58    07:00  Vital Signs Type Routine  Pulse Pulse 54  Respirations Respirations 45  Systolic BP Systolic BP 989  Diastolic BP (mmHg) Diastolic BP (mmHg) 35  Mean BP 63  Pulse Ox % Pulse Ox % 98  Pulse Ox Activity Level  At rest  Oxygen Delivery 4L; Nasal Cannula  Pulse Ox Heart Rate 54    08:00  Vital Signs Type Routine  Temperature Temperature (F) 98  Celsius 36.6  Temperature Source oral    Impression Mr.  [name] presents with another episode of fever, weight gain.  I had a long discussion with his wife ( in fact, most of the history waas provided by wife).  He has had these episodes previously.  He has known diastolic dysfunction and has an EF of 50%.  He denies any CP or worsening dyspnea.  His overall health has declined over the past several years.    At present his BP is much better after getting antibiotics.  he is chronically volume overloaded and has chronic lower extremity stasis changes.     At this point, I think that his main problems are due to his underlying  liver disease.  There is no evidence of acute coronary syndrome or worsening CHf.   Plan I have discussed the case with Dr. Laurin Coder.  I agree with the plan for IV albumin followed by diuresis.  Given his hepatic failure, we may not be able to completely resolve his ascites and his leg edema.   He should have his legs elevated , as tolerated.    he has normal LV systolic function; I do not think he will need pressors for BP control.  I think his hypotension was due to intravascular volume  depletion.   Electronic Signatures: Nahser, Dreama Saa (MD)  (Signed 11-Nov-14 09:38)  Authored: General Aspect/Present Illness, History and Physical Exam, Review of System, Past Medical History, Home Medications, Labs, Allergies, Vital Signs/Nurse's Notes, Impression/Plan   Last Updated: 11-Nov-14 09:38 by Nahser, Dreama Saa (MD)

## 2014-10-28 NOTE — Discharge Summary (Signed)
PATIENT NAME:  [name], [name] MR#:  [id number] DATE OF BIRTH:  05-02-1951  DATE OF ADMISSION:  05/17/2013 DATE OF DISCHARGE:  05/21/2013  CHIEF COMPLAINT: Shortness of breath.  DISCHARGE DIAGNOSES: 1.  Severe sepsis, with fever, altered mental status, tachycardia, tachypnea and hypertension. 2.  Bacteremia with Staphylococcus lugdunensis/coagulase-negative Staphylococcus. 3.  Altered mental status, due to sepsis and hepatic encephalopathy. 4.  Acute respiratory failure with hypoxia down to 88% on room air.  5.  Acute congestive heart failure, diastolic, now compensated.  6.  Acute renal failure.  7.  Anemia of chronic disease.  8.  Positive guaiac.  9.  Sleep apnea.  10.  Anemia of chronic disease with thrombocytopenia. 11.  Nonalcoholic hepatic steatosis. 12.  Lower extremity edema.  DISPOSITION: Home with home health for IV antibiotics.   DISCHARGE MEDICATIONS: Nadolol 20 mg once a day, Mucinex 50 mcg once a day, omeprazole 20 twice daily, furosemide 20 mg take 3 tablets 2 times a day, spironolactone 60 mg take 4 tablets once a day, nafcillin 2 g IV every 6 hours for 10 days, lactulose 10 grams once a day.   FOLLOW-UP: With new referral, Dr. Clydie Braun, due to home IV antibiotics and septicemia, in 1 to 2 weeks. Dr. Maryjane Hurter, primary care physician, in 1 to 2 weeks, and hepatology for followup on cirrhosis of the liver in the next couple of weeks.  The patient was discharged with a PICC line with home health to follow up on IV antibiotics, labs, and PICC line care.    HOSPITAL COURSE: This very nice, 64 year old gentleman, with history of gastroesophageal reflux disease, cirrhosis, secondary to non-alcoholic steatosis, and history of esophageal varices, previous gastrointestinal bleeding, who came with a history of increased shortness of breath and fevers with chills, and lower extremity edema increased for the last 24 hours. The patient did not have any cough, any sick  contacts. No chest pain. No palpitations. No orthopnea. He was found to be saturating 80%, with moderate to severe respiratory distress, and this was seen by the EMS. He was febrile on arrival to the Emergency Department. He was placed on BiPAP, and he started improving his oxygenation, up until the point that he was able to be weaned off the BiPAP. He received one dose of Lasix in the Emergency Department, and became relatively hypotensive.   The patient was admitted to the critical care unit, where, on my evaluation on the following morning was still very lethargic. His temperature went up to 102.2 and his pulse remained in the 70s, respirations up to 38, and initial blood pressure 153/86.   The patient was found to Staphylococcus bacteremia, and was treated with IV antibiotics during this hospitalization. The patient actually started to get better really quickly within the first 24 hours after starting antibiotics, including vancomycin and cefepime.   As far as other problems:  Sepsis. The patient had significant infection that the beginning, during the first stage of the workup, was not simple to identify the source. X-rays were done and did not show any significant consolidations or pneumonia. He did have some slight signs of pulmonary congestion due to fluid, but nothing out of the ordinary. The patient had a fever of 103. He presented with altered mental status during the night. He was tachycardic and tachypneic. He became slightly hypotensive, but never was in septic [name].   The patient was started on cefepime and vancomycin at admission. A CT of the chest was done to evaluate the  possibility of pneumonia, and the patient actually did not have any significant consolidations. Ultrasound of the abdomen to evaluate for the possibility of ascites and spontaneous bacterial peritonitis was done, and the patient actually did not have any amount of fluid on the abdomen, so no ascites at all.  Urinalysis  was done, and it was not revealing. His urine cultures came back without any significant infection.   The patient's blood cultures came back positive the following day for gram-positive cocci, and after started growing the cultures, we determined that the gram-positive cocci was coagulase-negative, so not likely to be a real infection and not a comntamination, and the final results showed Staphylococcus lugdunensis.  The patient had workup for his bacteremia, including an echocardiogram that did not show any vegetations or endocarditis. We asked cardiology to consider the possibility of TEE, but there was not any further recommendations and the patient said he would not like to go through that type of procedure. His echocardiogram was done, and was not really a very clear test.  Blood cultures were repeated and were negative. Once the cultures came back as negative cultures, given the results of the sensitivity showed the Staphylococcus was sensitive to nafcillin, for which nafcillin was started.  The patient underwent a PICC line placement, and he was discharged in good condition, with a total of 14 days of nafcillin IV since the day of admission. The patient also had significant altered mental status due to hepatic encephalopathy, with increased ammonia, for which he was started on lactulose. The patient was not taking lactulose previously. We gave him a prescription for daily use.  In the past, the patient had a history of pseudomonas septicemia, but at this moment there was no suspicion of any infection by Pseudomonas.   He had acute respiratory failure with oxygen saturation going down to 80%. This resolved after BiPAP was put on, and Lasix was given.  Acute congestive heart failure, diastolic, for which he received Lasix IV in the ER, and then his albumin was given in IV boluses, and the patient had a significant resolution of his acute kidney injury, which was secondary to sepsis and ATN. There  was a question of this being hepatorenal syndrome, but since it resolved so quickly, we ruled out that type of suspicion.  The patient has been receiving some albumin outpatient on IV drips once a week, and I think that this might be a possibility of the source of his bacteremia. Of course, the patient can enucleate himself while just scratching or having significant lesion on the skin. For this moment, there was not any excoriated skin to give me that suspicion, so it is likely that he contracted this infection through an IV access.   The patient actually did well during this hospitalization. His other medical problems were stable. He did have some anemia that was secondary to dilution. He did have a positive guaiac, but this is something that he had before, secondary to hemorrhoids.   The patient did have a history of esophageal varices but there was never any significant sign of bleeding or rupture.  At this moment, the patient is hemodynamically stable. A GI consultation was obtained to help with his newer problems. They were suggesting at the beginning to possibly do some scopes to evaluate for the positive guaiac, but the patient wanted to go back to his gastroenterologist and ask for his opinion.  The patient was discharged in good condition.   TIME SPENT: I spent about  60 minutes with this discharge  ____________________________ Felipa Furnaceoberto Sanchez Gutierrez, MD rsg:cg D: 05/24/2013 22:40:21 ET T: 05/24/2013 23:31:20 ET JOB#: [id number]  cc: Felipa Furnaceoberto Sanchez Gutierrez, MD, <Dictator> Yuka Lallier Juanda ChanceSANCHEZ GUTIERRE MD ELECTRONICALLY SIGNED 05/26/2013 23:14

## 2014-10-28 NOTE — Consult Note (Signed)
PATIENT NAME:  Gavin AristaUSTIN, [name] DATE OF BIRTH:  [birth year]  DATE OF CONSULTATION:  05/18/2013  REQUESTING PHYSICIAN:  Dr. Haydee Monicaoberta Sanchez. CONSULTING PHYSICIAN:  Geron Mulford Lizabeth LeydenN. Shandiin Eisenbeis, MD  REASON FOR CONSULTATION: Acute renal failure and evaluation of fluid balance.   HISTORY OF PRESENT ILLNESS: The patient is a 64 year old Caucasian male with past medical history of cirrhosis of the liver secondary to [place], GERD, hypertension, esophageal varices, obstructive sleep apnea, who presented to [facility] apparently with shortness of breath. The patient's shortness of breath has apparently been ongoing for one day prior to admission. He has also had fevers and chills at home as well as increasing lower extremity edema. Upon presentation he was found to have an elevated temperature of 103.2. The patient had a chest x-ray which showed cardiomegaly with vascular congestion. He is scheduled for a CT scan of the chest this a.m. Blood cultures thus far are negative. He denies abdominal pain or dysuria. We are asked to see the patient for evaluation and management of acute renal failure. However, he appears to have underlying chronic kidney disease as well. Baseline creatinine appears to be 1.3. Creatinine now is a 1.78. His urinalysis was found to be negative. There have also been periods of hypotension. Blood pressure has been as low as 88/36, when he first presented to the Critical Care Unit. The patient was on spironolactone as well as furosemide at home.   PAST MEDICAL HISTORY: 1. Cir[facility]osis of the liver [facility].  2. Esophageal varices.  3. GERD.  4. Hypertension.  5. Obstructive sleep apnea, on home CPAP.   ALLERGIES: No known drug allergies.   CURRENT INPATIENT MEDICATIONS: Include: Tylenol 650 mg p.o. every 4 hours p.r.n., heparin 5000 units subcutaneous every 8 hours, lactulose 30 mL p.o. b.i.d., omeprazole 20 mg p.o. b.i.d., Zofran 4 mg IV every 4 hours  p.r.n. cefepime 2 grams IV every 8 hours.,  Levofloxacin 750 mg IV x 1. Vancomycin 1500 mg IV every 18 hours. Levofloxacin 750 mg IV every 24 hours.  SOCIAL HISTORY:  The patient is married. [place] in [place]. He quit tobacco some time ago. Denies alcohol or illicit drug use.   FAMILY HISTORY: The patient's mother died of lung cancer and father died of cerebral hemorrhage.   REVIEW OF SYSTEMS:  CONSTITUTIONAL: The patient endorses fevers and chills.  EYES: Denies diplopia, blurry vision.  HEENT: Denies headaches, hearing loss. Denies epistaxis.  CARDIOVASCULAR: Denies chest pain, palpitations.  RESPIRATORY: Endorses shortness of breath, but denies hemoptysis.  GASTROINTESTINAL: Denies nausea, vomiting, diarrhea.  GENITOURINARY: Denies frequency, urgency, or dysuria.  MUSCULOSKELETAL: Reports pain in bilateral lower extremities. Otherwise, no joint pains.  INTEGUMENTARY: Denies skin rashes or lesions.  NEUROLOGIC: Denies focal extremity numbness, weakness, or tingling.  PSYCHIATRIC: Denies depression, bipolar disorder.  ENDOCRINE: Denies polyuria, polydipsia.  HEMATOLOGIC AND LYMPHATIC: Denies easy bruisability, bleeding, or swollen lymph nodes. ALLERGY AND RHEUMATOLOGIC:  Denies seasonal allergies or history of immunodeficiency.   PHYSICAL EXAMINATION: VITAL SIGNS: Temperature 98, pulse 54, respirations 45, blood pressure 88/40, pulse oximetry 98%.  GENERAL: Reveals well-developed, well-nourished, obese male seen in the Critical Care Unit.  HEENT: Normocephalic, atraumatic. Extraocular movements are intact. Pupils equal, round, reactive to light. There is scleral icterus. Conjunctivae are pink. No epistaxis noted. Gross hearing intact. Oral mucosa are very dry.  NECK: Supple without JVD or lymphadenopathy.  LUNGS: Demonstrate diminished breath sounds at the bases, otherwise clear with normal respiratory effort.  HEART: S1,  S2 regular rate and rhythm. No murmurs, rubs, or gallops  appreciated.  ABDOMEN: Obese, soft, nontender, currently nondistended. No rebound or guarding. No gross organomegaly appreciated.  EXTREMITIES: No clubbing or cyanosis. 2+ pitting edema noted.  NEUROLOGIC: The patient is currently awake and alert, and oriented to time and place. He follows simple commands with  both upper and lower extremities.  SKIN: Warm and dry. No rashes noted.  MUSCULOSKELETAL: No joint redness, swelling or tenderness appreciated.  GENITOURINARY: No suprapubic tenderness is noted at this time.  PSYCHIATRIC: The patient with appropriate affect and has some insight into his illness.   LABORATORY DATA: Sodium 136, potassium 3.7, chloride 104, CO2 26, BUN 25, creatinine 1.78, glucose 109, total protein 6.5, albumin 3.1, total bilirubin 4.1. Troponin 0.03. CBC shows WBC 9.5, hemoglobin 9.2, hematocrit 27.4, platelets 63. Blood culture x 2 sets are negative. Urinalysis is unremarkable. ABG shows pH of 7.52, pCO2 32, pO2 478. 2-D echocardiogram from 01/02/2013 shows a normal ejection fraction 50% to 55%.   IMPRESSION: This is a 64 year old Caucasian male with a past medical history of cirrhosis secondary to nonalcoholic steatohepatitis, esophageal varices, obstructive sleep apnea, gastroesophageal reflux disease, hypertension, chronic kidney disease stage III with baseline creatinine 1.3 to 1.4 who presented to [facility] with shortness of breath.   PROBLEM LIST: 1. Acute renal failure.  2. Chronic kidney disease stage III with baseline creatinine 1.3 to 1.4.  3. Cirrhosis of the liver secondary to [place].  4. Lower extremity edema.  5. Hypotension.  6. Fevers.   PLAN: The patient initially presented to [facility] with shortness of breath. He was initially thought to have a possible pneumonia. A definitive source of infection and has not been determined.  The patient does appear to have acute renal failure now, which is likely secondary to  acute tubular necrosis, likely from hypotension. I agree with holding diuretic therapy. I also agree with continued IV fluid hydration with 0.9 normal saline at 50 mL per hour. We will also administer the patient albumin 25 grams IV every eight hours for the next 48 hours. We will also check a renal ultrasound as well as urine electrolytes. There is certainly no indication for dialysis at this point in time. Would continue to avoid nephrotoxins such as intravenous contrast and NSAIDs. Also, we would recommend dosing medications for renal function. Pharmacy consults have been obtained to dose his antibiotics.   I would like to thank Dr. Mordecai Maes for this kind referral. Further plan as the patient progresses.  ____________________________ Lennox Pippins, MD mnl:sg D: 05/18/2013 09:52:19 ET T: 05/18/2013 10:15:46 ET JOB#: [id number]  cc: Lennox Pippins, MD, <Dictator> Ria Comment Vivika Poythress MD ELECTRONICALLY SIGNED 05/26/2013 10:12

## 2014-10-28 NOTE — Op Note (Signed)
PATIENT NAME:  [name], [name] MR#:  [id number] DATE OF BIRTH:  1950-10-04  DATE OF PROCEDURE:  05/20/2013  PREOPERATIVE DIAGNOSIS:  Coagulation-negative Staphylococcus bacteremia.   POSTOPERATIVE DIAGNOSIS: Coagulation-negative Staphylococcus bacteremia.   PROCEDURES:  1. Ultrasound guidance for vascular access to the basilic vein.  2. Fluoroscopic guidance for placement of catheter.  3. Insertion of peripherally inserted central venous catheter, single-lumen PICC line, left arm.  SURGEON: Levora DredgeGregory Ziquan Fidel, M.D.   ANESTHESIA: Local.   ESTIMATED BLOOD LOSS: Minimal.   INDICATION FOR PROCEDURE: Requiring IV antibiotics greater than 5 days.   DESCRIPTION OF PROCEDURE: The patient's left arm was sterilely prepped and draped, and a sterile surgical field was created. The basilic vein was accessed under direct ultrasound guidance without difficulty with a micropuncture needle and permanent image was recorded. 0.018 wire was then placed into the superior vena cava. Peel-away sheath was placed over the wire. A single lumen peripherally inserted central venous catheter was then placed over the wire and the wire and peel-away sheath were removed. The catheter tip was placed into the superior vena cava and was secured at the skin at 45 cm with a sterile dressing. The catheter withdrew blood well and flushed easily with heparinized saline. The patient tolerated procedure well.   ____________________________ Renford DillsGregory G. Pegah Segel, MD ggs:np D: 05/21/2013 14:58:12 ET T: 05/21/2013 16:34:47 ET JOB#: [id number]  cc: Renford DillsGregory G. Angeleah Labrake, MD, <Dictator> Renford DillsGREGORY G Andric Kerce MD ELECTRONICALLY SIGNED 06/14/2013 17:15

## 2014-10-28 NOTE — H&P (Signed)
PATIENT NAME:  [name], [name] MR#:  [id number] DATE OF BIRTH:  1951-02-15  DATE OF ADMISSION:  12/31/2012  PRIMARY CARE PHYSICIAN:  Dr. Maudie Flakes.   CHIEF COMPLAINT:  Weakness, fever, shortness of breath.   HISTORY OF PRESENT ILLNESS:  A 64 year old male patient with history of cirrhosis, chronic thrombocytopenia, sleep apnea, morbid obesity presents to the Emergency Room complaining of fever of 102 at home.  Here, he had temperature of 100.3 after Tylenol at home.  The patient has been short of breath needing 1 to 2 liters of oxygen.  His chest x-ray shows bilateral basilar pneumonia and he is being admitted to the hospitalist service.  The patient has not used any recent antibiotics.  He does have a cough with no sputum.  No PND, orthopnea.  Does have bilateral lower extremity swelling which is chronic, unchanged.  The patient was last admitted to the hospital in 2012 with acute respiratory failure, was intubated then, had acute renal failure which resolved and has done well since.  No recent long journeys.  No history of cancer.  No history of DVT, PE.  A d-dimer was checked here in the Emergency Room.  It is elevated at 1.44.  Secondary to patient's weight of 415 pounds, CAT scan cannot be done.   PAST MEDICAL HISTORY: 1.  Grade 2 varices.  2.  Nonalcoholic steatohepatitis with cirrhosis.  3.  Hemorrhoids.  4.  Sleep apnea.  5.  Anemia of chronic disease.   PAST SURGICAL HISTORY:  Hernia surgery, carpal tunnel syndrome surgery.   ALLERGIES:  No known drug allergies.   FAMILY HISTORY:  Father died of cerebral hemorrhage.  He also had diabetes, coronary artery disease.   SOCIAL HISTORY:  The patient is married, lives with his wife.  He quit smoking in 2000.  Does not drink alcohol.  No illicit drugs.  Was a Therapist, music.   REVIEW OF SYSTEMS:  CONSTITUTIONAL:  Complains of fatigue, weakness.  No weight loss, weight gain.  EYES:  No blurred vision, pain, redness.  EARS, NOSE,  THROAT:  No tinnitus, ear pain, hearing loss.  RESPIRATORY:  Has a dry cough and shortness of breath.  CARDIOVASCULAR:  No chest pain, orthopnea.  Has chronic bilateral lower extremity edema.   GASTROINTESTINAL:  No nausea, vomiting, diarrhea.  GENITOURINARY:  No dysuria, hematuria, frequency.  ENDOCRINE:  No polyuria, nocturia, thyroid problems.  HEMATOLOGIC AND LYMPHATIC:  Has chronic anemia and thrombocytopenia.  No bleeding or bruising.  INTEGUMENTARY:  No acne, rash, lesions.  MUSCULOSKELETAL:  No arthritis, back pain.  NEUROLOGIC:  No focal numbness, weakness, dysarthria, seizures.  PSYCHIATRIC:  No anxiety or depression.   HOME MEDICATIONS:  Include:  1.  Lasix 40 mg oral once a day.  2.  Nadolol 20 mg oral once a day at bedtime.  3.  Nasonex 50 mcg nasal spray once a day.  4.  Omeprazole 20 mg oral 2 times a day.  5.  Spironolactone 50 mg oral once a day.   PHYSICAL EXAMINATION: VITAL SIGNS:  Shows temperature 100.3, pulse of 76, blood pressure 140/63, saturating 93% on 2 liters oxygen.  GENERAL:  Morbidly obese Caucasian male patient lying in bed in significant respiratory distress, tachypnea.  Conversational dyspnea.  PSYCHIATRIC:  Alert and oriented x 3.  Is anxious, restless.  HEENT:  Atraumatic, normocephalic.  Oral mucosa moist and pink.  External ears and nose normal.  No pallor.  No icterus.  Pupils bilaterally equal and reactive to light.  NECK:  Supple.  No thyromegaly.  No palpable lymph nodes.  Trachea midline.  No carotid bruit, JVD.  CARDIOVASCULAR:  S1, S2, has a systolic murmur.  Bilateral lower extremity nonpitting edema.  Pulses 2+.  RESPIRATORY:  Bilateral mild wheezing.  Decreased air entry at the bases.  No crackles.  GASTROINTESTINAL:  Soft abdomen, nontender.  Bowel sounds present.  Difficult to palpate any hepatosplenomegaly secondary to obesity.  No scars. GENITOURINARY:  No CVA tenderness or bladder distention.  SKIN:  Warm and dry.  No petechiae, rash,  ulcers.  MUSCULOSKELETAL:  No joint swelling, redness, effusion of the large joints.  Normal muscle tone.  NEUROLOGICAL:  Motor strength 5 by 5 in upper and lower extremities.  Sensation is intact all over.  LYMPHATIC:  No cervical lymphadenopathy.   LABORATORY AND IMAGING STUDIES:  Show glucose of 98, BNP 597, BUN 19, creatinine 1.38, sodium 140, potassium 4.1, chloride 108.  AST, ALT, alkaline phosphatase normal.  Bilirubin 2.5, albumin 2.9.  Troponin less than 0.02.  WBC 8.5, hemoglobin 8.3, platelets of 68 with MCV 74, INR 1.7.  D-dimer of 1.44.   Chest x-ray done today on personal review seems to show bilateral basilar pneumonia.  Mild interstitial edema.  No pneumothorax.   EKG shows a normal sinus rhythm with first-degree AV block.  PR interval of 216.  No acute ST-T wave changes.   ASSESSMENT AND PLAN: 1.  Acute respiratory failure in a patient with bilateral basilar pneumonia.  We will start him on ceftriaxone, azithromycin, send for blood and sputum cultures.  Continue oxygen support.  The patient with a history of sleep apnea, will be on CPAP at night here in the hospital.  We will put him on scheduled nebulizers.  2.  Sleep apnea.  Continue CPAP.  3.  Anemia of chronic disease and thrombocytopenia, stable.  No heparin for DVT prophylaxis.  4.  Elevated BNP.  The patient does not have any history of congestive heart failure, but has a significant murmur and with his obesity, sleep apnea, has high chances of heart failure.  Does not seem to be fluid-overloaded, but is on Lasix at home.  We will continue his home Lasix, spironolactone.  Get an echocardiogram.   CODE STATUS:  FULL CODE.   Time spent today on this case was 40 minutes.     ____________________________ Molinda BailiffSrikar R. Jeralyn Nolden, MD srs:ea D: 12/31/2012 23:16:46 ET T: 01/01/2013 01:02:14 ET JOB#: [id number]  cc: Wardell HeathSrikar R. Revan Gendron, MD, <Dictator> Dr. Maudie Flakesale Feldpausch Orie FishermanSRIKAR R Sharlot Sturkey MD ELECTRONICALLY SIGNED 01/01/2013 15:52

## 2014-11-06 NOTE — H&P (Signed)
PATIENT NAME:  [name], [name] MR#:  [id number] DATE OF BIRTH:  05/29/1951  DATE OF ADMISSION:  07/18/2014  PRIMARY CARE PHYSICIAN:  Dr. Maudie Flakesale Feldpausch.   REQUESTING PHYSICIAN: Dr. Jene Everyobert Kinner.    CHIEF COMPLAINT: Altered mental status.   HISTORY OF PRESENT ILLNESS: The patient is a 64 year old male with a known history of nonalcoholic steatohepatitis and anemia of chronic disease, is being admitted for hepatic encephalopathy. The patient was recently admitted at [facility] for same from January 2 until January 4 when he was released. He was back to his normal mental status, he was doing fine until this morning when his family member noted that while going to the bathroom he was  unsteady on his feet, he was groggy also, he was barely arousable to verbal stimuli and they called EMS and brought him down to the Emergency Department. Here while in the ED he had a temperature of 101. He is barely arousable to verbal stimuli and deep painful stimuli and he is being admitted for further evaluation and management.    PAST MEDICAL HISTORY:  1.  Nonalcoholic steatohepatitis.  2.  History of hemorrhoids.  3.  Anemia of chronic disease.  4.  History of diastolic heart failure.  5.  History of chronic thrombocytopenia.  6.  Chronic lower extremity edema.  7.  Obstructive sleep apnea on home CPAP setting of 20.   REVIEW OF SYSTEMS: Unobtainable as the patient is obtunded.   SOCIAL HISTORY: Former tobacco user. No alcohol or drug use. He lives at home with his wife.   FAMILY HISTORY: Positive for diabetes and coronary disease.   ALLERGIES: No known drug allergies.   MEDICATIONS AT HOME:  1. Baclofen 10 mg p.o. 3 times a day.  2. Lasix 40 mg 3 tablets p.o. every morning and 2 tablets in the evening.  3. Lactulose 20 grams p.o. 3 times a day.  4. Nadolol 20 mg p.o. daily.  5. Nasonex spray intranasally daily.  6. Omeprazole 20 mg p.o. Brewer.i.d.  7. Rifaximin 550 mg p.o. Brewer.i.d.   8. Spironolactone 50 mg 3 tablets p.o. every morning and 1 in the evening.   9. Zofran 4 mg 2 tablets p.o. every 8 hours as needed.   PHYSICAL EXAMINATION:  VITAL SIGNS: Temperature 101, heart rate 65 per minute, respirations 24 per minute, blood pressure 130/69. He is saturating 98% on room air.  GENERAL: The patient is a 64 year old male lying in the bed comfortably without any acute distress.  EYES: Pupils equal, round, reactive to light and accommodation. No scleral icterus. Extraocular muscles intact.  HEENT: Head atraumatic, normocephalic. Oropharynx and nasopharynx clear.  NECK: Supple.  No venous distention. No thyroid enlargement or tenderness.  LUNGS: Clear to auscultation bilaterally. No wheezing, rales, rhonchi, or crepitation.  CARDIOVASCULAR: S1, S2 normal. No rubs or gallop.  3/6 systolic ejection murmur present. ABDOMEN: Soft, obese, nontender, nondistended. Bowel sounds present.  No organomegaly or mass.  EXTREMITIES: He has bilateral lower extremity edema about 1 +. No cyanosis or clubbing.  SKIN: He has minimal erythema on his right lower extremity below knee mainly on the shin region. No signs of ulcerations. No other rashes.  NEUROLOGIC: Cranial nerves II through XII seem intact. Sensation is intact. Muscle strength 4/5 in all extremities.  It is difficult exam due to his obtundation. He does have some flapping tremors bilaterally.  MUSCULOSKELETAL: No joint effusion or tenderness.  PSYCHIATRIC: The patient is obtunded. He is arousable to verbal stimuli though  falls back to sleep very easily. Difficult to evaluate insight and judgment at this time due to his mental status.   LABORATORY PANEL: Normal BMP except BUN of 22, creatinine 1.67.  Ammonia of 65. Normal LFTs except AST of 49. Total bilirubin of 5.7. Troponin normal with a value of 0.04. CBC showed white count of 16.1, hemoglobin 10.1, hematocrit 31.7, platelets 60,000. UA shows 1 + bacteria, otherwise negative.   CT  scan of the head without contrast in the ED shows some minimal diffuse cortical atrophy. No acute intracranial abnormality. Chest x-ray shows  PowerPort catheter in a good anatomic position. Cardiomegaly with pulmonary vascular congestion.   EKG shows normal sinus rhythm with first-degree AV block, no major ST-T changes.   IMPRESSION AND PLAN:  1.  Hepatic encephalopathy. We will start him on rifaximin and lactulose at this time. Consider GI consult if needed. He does follow with [facility] hepatology as an outpatient.  We will monitor his ammonia level.   2.  Possible sepsis with fever, leukocytosis, altered mental status. Certainly could have some infection underlying. Please note he did have bacteremia on his last admission here at [facility], so for now we will empirically cover him with vancomycin and Zosyn. He does have minimal redness in his right lower extremity which certainly could be a source although unlikely. Also of note he does have a PowerPort in place in his right upper chest, do not see any signs of infection there. We will repeat chest x-ray in the morning.  3.  Acute renal failure, likely prerenal. We will hold spironolactone and Lasix at this time. We will give gentle hydration with IV fluids and monitor his renal function, avoid any nephrotoxins. 4.  Thrombocytopenia, chronic in nature, likely from underlying liver disease (nonalcoholic steatohepatitis). We will monitor, avoid any platelet-lowering agents including heparin or Lovenox.  5.  Code status. Full code.   TOTAL TIME TAKING CARE OF THIS PATIENT: 45 minutes.     ____________________________ Ellamae Sia. Sherryll Burger, MD vss:bu D: 07/18/2014 15:05:03 ET T: 07/18/2014 16:09:07 ET JOB#: [id number]  cc: Cheikh Bramble S. Sherryll Burger, MD, <Dictator> Marina Goodell, MD [facility] hepatology Ellamae Sia [facility] MD ELECTRONICALLY SIGNED 07/21/2014 22:11

## 2014-11-06 NOTE — Discharge Summary (Signed)
PATIENT NAME:  [name], [name] MR#:  [id number] DATE OF BIRTH:  1950/07/19  DATE OF ADMISSION:  07/18/2014 DATE OF DISCHARGE:  07/20/2014  DISCHARGE DIAGNOSES:  1.  Hepatic encephalopathy.  2.  Acute renal failure.    SECONDARY DIAGNOSES:   1.  Nonalcoholic steatohepatitis.  2.  History of hemorrhoids.  3.  Anemia of chronic disease.  4.  History of diastolic heart failure.  5.  History of chronic thrombocytopenia.  6.  Chronic lower extremity edema.  7.  Obstructive sleep apnea on home CPAP setting of 20   CONSULTATIONS: None.   PROCEDURES AND RADIOLOGY: CT scan of the head without contrast on 07/18/2014 showed no acute pathology.   Chest x-ray on 07/18/2014 showed cardiomegaly with pulmonary venous congestion. PowerPort catheter in good anatomic position.   Chest x-ray on 07/19/2014 showed cardiomegaly; no focal consultation.   Major laboratory panel, urinalysis on admission was negative. Blood cultures x 2 were negative. Urine culture had no growth.   HISTORY AND SHORT HOSPITAL COURSE: The patient is a 64 year old male with the above-mentioned medical problems who was admitted for altered mental status, thought to be due to hepatic encephalopathy. Please see Dr. Patricia PesaVipul Niall Illes's dictated history and physical for further details. The patient was started on lactulose and rifaximin and was slowly responding. His mental status was improving and was much better by 07/20/2014, close to his baseline, and he was discharged home in stable condition.   VITAL SIGNS: On the date of discharge, his vital signs are as follows: Temperature 98.1, heart rate 55 per minute, respirations 18 per minute, blood pressure 101/61. He was saturating 97% on room air.   PERTINENT PHYSICAL EXAMINATION ON THE DATE OF DISCHARGE:  CARDIOVASCULAR: S1, S2 normal. No murmurs, rubs, or gallops.  LUNGS: Clear to auscultation bilaterally. No wheezing, rales, rhonchi, crepitation.  ABDOMEN: Soft, obese, benign.   NEUROLOGIC: Nonfocal examination. All other physical examination remained at baseline.   DISCHARGE MEDICATIONS:    Medication Instructions  nasonex 50 mcg/inh nasal spray  1 spray(s) nasal once a day, As Needed for rhinitis.    omeprazole 20 mg oral delayed release capsule  1 cap(s) orally 2 times a day   furosemide 40 mg oral tablet  3 tab(s) orally once a day (in the morning) and 2 in the evening.    nadolol 20 mg oral tablet  1 tab(s) orally once a day (in the evening)   spironolactone 50 mg oral tablet  3 tab(s) orally once a day (in the morning) and 1 in the evening.    baclofen 10 mg oral tablet  1 tab(s) orally 3 times a day   zofran 4 mg oral tablet  2 tab(s) orally every 8 hours, As Needed - for Nausea, Vomiting   lactulose 20 g oral powder for reconstitution  20 gram(s) orally 3 times a day   rifaximin 550 mg oral tablet  1 tab(s) orally 2 times a day      DISCHARGE DIET: Low sodium, low fat, low cholesterol.   DISCHARGE ACTIVITY: As tolerated.   DISCHARGE INSTRUCTIONS AND FOLLOWUP: Patient was instructed to follow up with his primary care physician, Dr. Maudie Flakesale Brewer, in 1-2 weeks. He will need [facility] in 2-4 weeks.   TOTAL TIME DISCHARGING THIS PATIENT: 45 minutes.    ____________________________ Ellamae SiaVipul S. Sherryll BurgerShah, MD vss:MT D: 07/22/2014 23:56:32 ET T: 07/23/2014 08:40:54 ET JOB#: [id number]  cc: [name] S. Sherryll BurgerShah, MD, <Dictator> Marina Goodellale E. Feldpausch, MD [facility] Afshin Chrystal  Margaretmary Eddy MD ELECTRONICALLY SIGNED 07/28/2014 10:11

## 2014-11-16 ENCOUNTER — Ambulatory Visit (INDEPENDENT_AMBULATORY_CARE_PROVIDER_SITE_OTHER): Payer: Medicare Other | Admitting: Cardiovascular Disease

## 2014-11-16 ENCOUNTER — Encounter: Payer: Self-pay | Admitting: Cardiovascular Disease

## 2014-11-16 VITALS — BP 128/62 | HR 50 | Ht 71.0 in | Wt 367.0 lb

## 2014-11-16 DIAGNOSIS — K7460 Unspecified cirrhosis of liver: Secondary | ICD-10-CM | POA: Diagnosis not present

## 2014-11-16 DIAGNOSIS — R600 Localized edema: Secondary | ICD-10-CM

## 2014-11-16 DIAGNOSIS — R0602 Shortness of breath: Secondary | ICD-10-CM

## 2014-11-16 DIAGNOSIS — I5032 Chronic diastolic (congestive) heart failure: Secondary | ICD-10-CM

## 2014-11-16 DIAGNOSIS — D649 Anemia, unspecified: Secondary | ICD-10-CM

## 2014-11-16 MED ORDER — POTASSIUM CHLORIDE ER 10 MEQ PO TBCR: 10.0000 meq | EXTENDED_RELEASE_TABLET | Freq: Every day | ORAL | Status: AC | PRN

## 2014-11-16 NOTE — Patient Instructions (Addendum)
You are doing well.  Please take potassium as needed for excessive urination or cramping  If you have trouble with fluid retention, We could add metolazone as needed with lasix Or change lasix to torsemide daily  Please call us if you have new issues that need to be addressed before your next appt.  Your physician wants you to follow-up in: 6 months.  You will receive a reminder letter in the mail two months in advance. If you don't receive a letter, please call our office to schedule the follow-up appointment.

## 2014-11-16 NOTE — Assessment & Plan Note (Signed)
Recommended that he stay on his current diuretic regimen. I suspect his symptoms of fatigue and shortness of breath are secondary to anemia, deconditioning. If leg edema gets worse, metolazone could be added sparingly, or Lasix could be changed to torsemide

## 2014-11-16 NOTE — Assessment & Plan Note (Signed)
Follow[facility] at [facility], NASH. On high-dose diuretics including Lasix and Aldactone Nadolol likely contributing to his bradycardia. Unclear if bradycardia contributing to his fatigue and shortness of breath.

## 2014-11-16 NOTE — Assessment & Plan Note (Signed)
No significant pitting edema. Stable renal function. Recommended he continue on his Lasix regimen. Metolazone could be added sparingly if needed for weight gain, shortness of breath

## 2014-11-16 NOTE — Progress Notes (Signed)
Patient ID: [name], male    DOB: January 27, 1951, 64 y.o.   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 He presents for f/u.  He has a history of chronic anemia. History of transfusion with packed red blood cells x2 in June 2014. history of varices and portal hypertension. He presents for follow-up of his chronic diastolic CHF  In follow-up today, he reports having increasing shortness of breath, weight gain. He takes Lasix 120 mg in the morning, 80 mg the afternoon, Aldactone 150 mg in the morning, 50 mg in the afternoon He receives 3 units of albumin every week Review of his lab work with him shows chronic anemia with recent hematocrit 27, INR 2.0  Echocardiogram also reviewed with him showing normal LV systolic function, dilated right and left atrium, diastolic dysfunction [facility]  Recent evaluation in the emergency room January 2016 documenting acute hepatic encephalopathy, pancytopenia  He reports having leg swelling, some abdominal swelling but not severe. He has significant fatigue and shortness of breath with exertion  EKG on today's visit shows normal sinus rhythm with rate 50 bpm, no significant ST or T-wave changes  Other past medical history  He [facility]ord [facility]Barrett [facility] for his liver issues. MRI done in December 2010.   admitted to the hospital June 2012 for acute respiratory failure, started on BiPAP, also with hypotension, acute renal failure secondary to hypovolemia. He was in the ICU for several days on BiPAP and pressors. It was felt that he had bacteremia as he had chills and fever prior to presentation. Notes indicate etiology of his infection was uncertain. He was started on Zosyn for 7 days. Possibly related to his underlying liver disease  (?SBP).  BUN 2 6 and creatinine 2.07 on arrival to the hospital. Creatinine on discharge was 1.0   admission June 26  for Pseudomonas septicemia, acute diastolic CHF. He had antibiotics, diuresis with improvement of his symptoms. Since discharge, he has been taking Lasix 40 mg twice a day. Report significant weight loss since his discharge.He had packed red blood cells x2.  Total cholesterol 181, LDL 118, hemoglobin A1c 5.7  Echocardiogram in hospital 01/02/2013 showed normal LV function, mildly dilated left atrium, moderate elevated right ventricular systolic pressures estimated at 40 mm mercury    No Known Allergies  Current Outpatient Prescriptions on File Prior to Visit  Medication Sig Dispense Refill  . baclofen (LIORESAL) 10 MG tablet Take 10 mg by mouth 3 (three) times daily.    . furosemide (LASIX) 40 MG tablet Take 3 tablets every am, 2 tablets every pm.    . LACTULOSE PO Take 30 mLs by mouth daily.     . mometasone (NASONEX) 50 MCG/ACT nasal spray Place 2 sprays into the nose daily as needed.     . nadolol (CORGARD) 20 MG tablet Take 20 mg by mouth daily.      . NON FORMULARY CPAP at bedtime.    Marland Kitchen. omeprazole (PRILOSEC) 20 MG capsule Take 20 mg by mouth 2 (two) times daily before a meal.     . spironolactone (ALDACTONE) 50 MG tablet Take 3 tablets in the am, 1 tablet in the pm.     No current facility-administered medications on file prior to visit.  Past Medical History  Diagnosis Date  . BPH (benign prostatic hypertrophy)   . Gastric ulcer   . Cirrhosis of liver   . Carpal tunnel syndrome   . Lower extremity edema   . Sleep apnea     CPAP  . Inguinal hernia   . Liver disease   . Anemia 2014  . Anal fissure 2014  . Pseudomonas septicemia   . Acute diastolic CHF (congestive heart failure)   . Hypertension     Past Surgical History  Procedure Laterality Date  . Inguinal hernia repair    . Carpal tunnel release    . Hemorrhoid surgery      Social  History  reports that he quit smoking about 15 years ago. His smoking use included Cigarettes. He has a 15 pack-year smoking history. He does not have any smokeless tobacco history on file. He reports that he does not drink alcohol or use illicit drugs.  Family History family history includes Coronary artery disease in his father; Diabetes in his father; Hypertension in his father; Lung cancer in his mother; Nephrolithiasis in his father; Prostate cancer in his father; Skin cancer in his father.  Review of Systems  Constitutional: Positive for fatigue.  Respiratory: Positive for shortness of breath.   Cardiovascular: Positive for leg swelling.  Gastrointestinal: Negative.   Endocrine: Negative.   Musculoskeletal: Negative.   Skin: Negative.   Neurological: Negative.   Hematological: Negative.   Psychiatric/Behavioral: Negative.   All other systems reviewed and are negative.   BP 128/62 mmHg  Pulse 50  Ht 5\' 11"  (1.803 m)  Wt 367 lb (166.47 kg)  BMI 51.21 kg/m2  Physical Exam  Constitutional: He is oriented to person, place, and time. He appears well-developed and well-nourished.  Morbidly obese  HENT:  Head: Normocephalic.  Nose: Nose normal.  Mouth/Throat: Oropharynx is clear and moist.  Eyes: Conjunctivae are normal. Pupils are equal, round, and reactive to light.  Neck: Normal range of motion. Neck supple. No JVD present.  Cardiovascular: Normal rate, regular rhythm, S1 normal, S2 normal, normal heart sounds and intact distal pulses.  Exam reveals no gallop and no friction rub.   No murmur heard. Edema is nonpitting, compression hose in place  Pulmonary/Chest: Effort normal and breath sounds normal. No respiratory distress. He has no wheezes. He has no rales. He exhibits no tenderness.  Abdominal: Soft. Bowel sounds are normal. He exhibits no distension. There is no tenderness.  Musculoskeletal: Normal range of motion. He exhibits edema. He exhibits no tenderness.   Lymphadenopathy:    He has no cervical adenopathy.  Neurological: He is alert and oriented to person, place, and time. Coordination normal.  Skin: Skin is warm and dry. No rash noted. No erythema.  Psychiatric: He has a normal mood and affect. His behavior is normal. Judgment and thought content normal.      Assessment and Plan   Nursing note and vitals reviewed.

## 2014-11-16 NOTE — Assessment & Plan Note (Signed)
Stable blood count though likely contributing to his symptoms. Not low enough for transfusion. Etiology of his pancytopenia likely anemia of chronic disease.  Does not appear to have blood loss anemia. This will likely contribute to his fatigue and shortness of breath

## 2014-11-16 NOTE — Assessment & Plan Note (Signed)
We have encouraged continued exercise, careful diet management in an effort to lose weight. 

## 2014-11-18 IMAGING — CR DG CHEST 1V PORT
1 series · 1 of 1 positions shown · non-contrast
Comparison: none

REASON FOR EXAM: Shortness of breath, weakness
COMMENTS:

[ap]
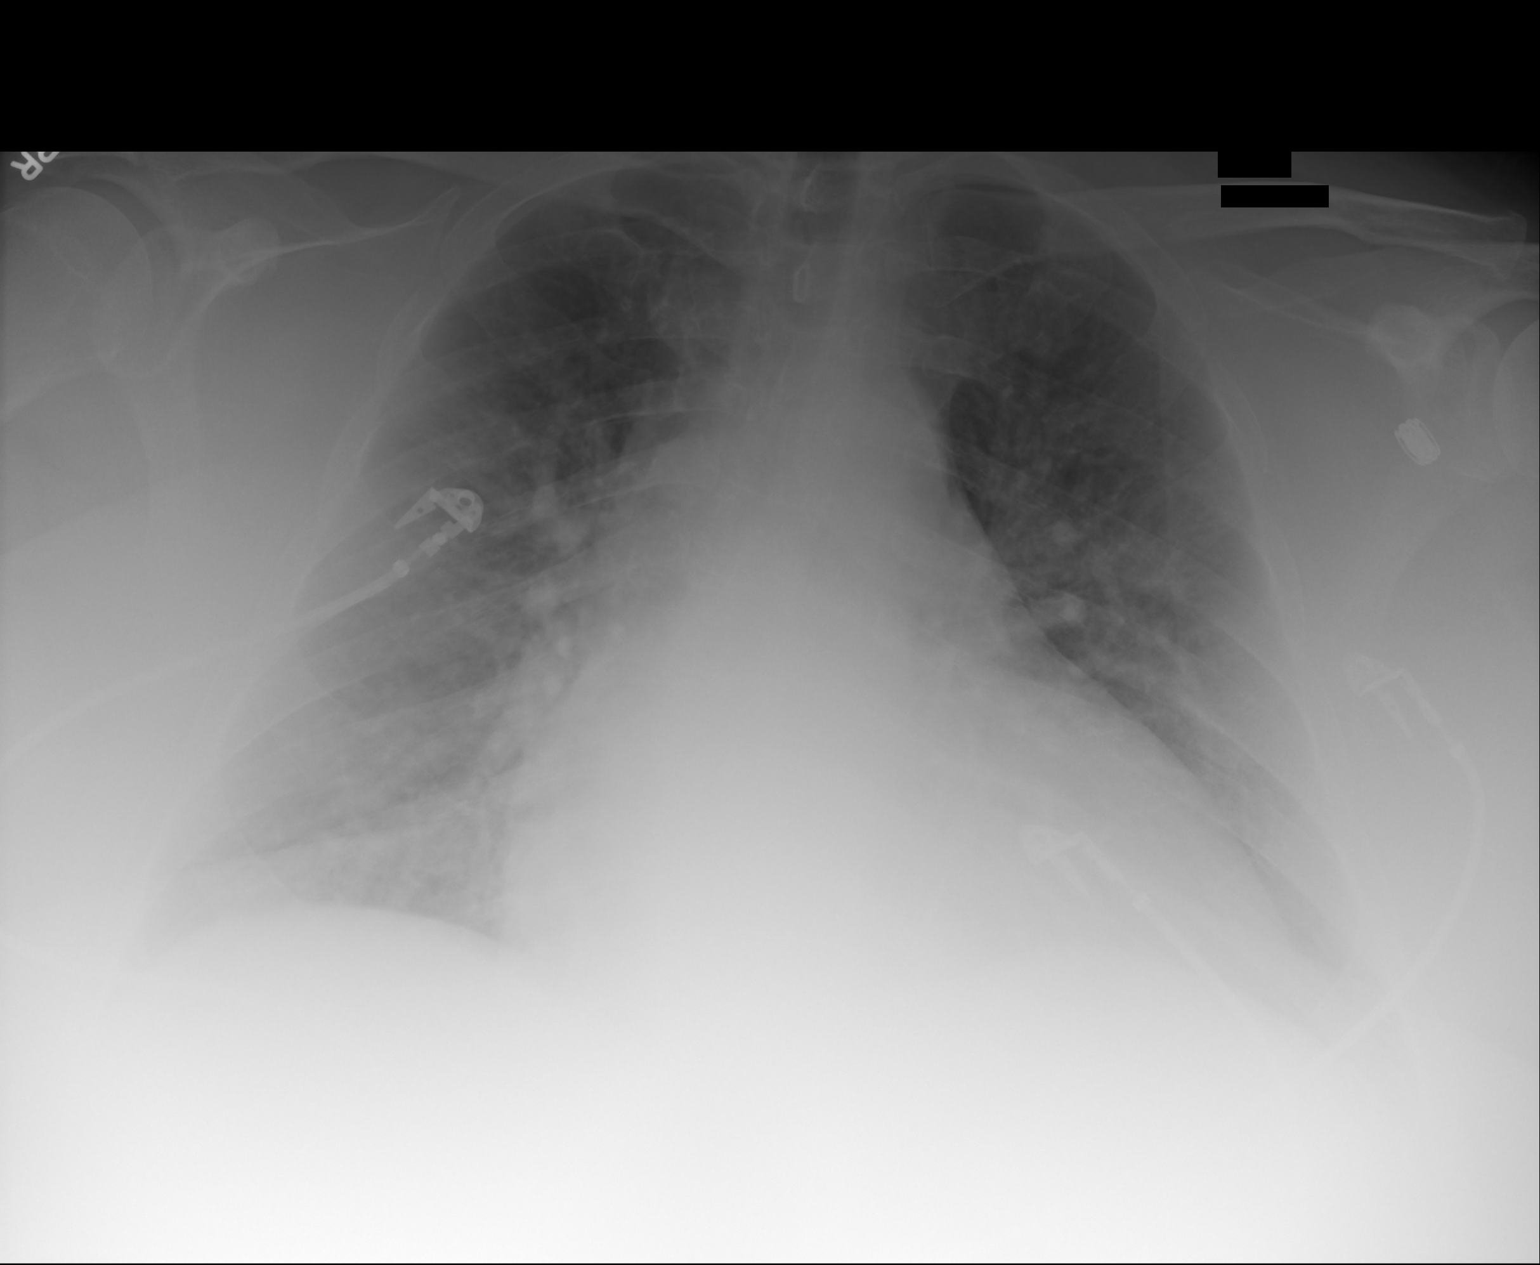

[1 of 1 positions shown; findings below may reference images not displayed]

PROCEDURE:     DXR - DXR PORTABLE CHEST SINGLE VIEW  - December 31, 2012  [DATE]

RESULT:     Comparison is made to the study of 12/25/2010. The cardiac
silhouette is enlarged. There is pulmonary vascular congestion with diffuse
interstitial edema. There is no pneumothorax. The bony and mediastinal
structures are unremarkable.
IMPRESSION: 1. Cardiomegaly with pulmonary vascular congestion.

[REDACTED]

## 2014-11-19 IMAGING — NM NM LUNG SCAN
2 series · 15 of 15 positions shown · non-contrast
Comparison: none

REASON FOR EXAM: hypoxia, elevated d ddimer
COMMENTS:

[Series 1000: lung perfusion · 1.95mm/px · 4 acquisitions, 7 frames shown]
[im 1/4]
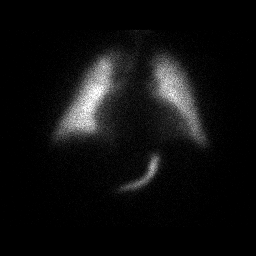
[im 1/4]
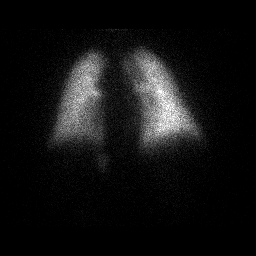
[im 2/4  full-range]
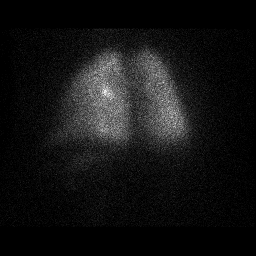
[im 3/4  full-range]
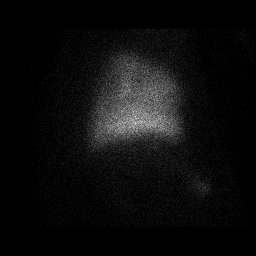
[im 3/4  full-range]
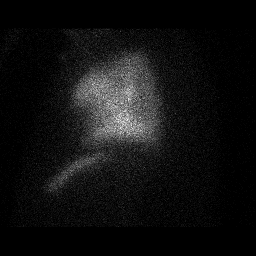
[im 4/4]
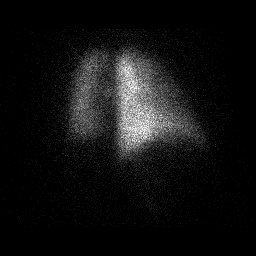
[im 4/4]
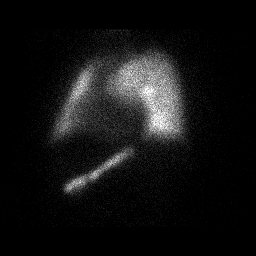

[Series 1000: lung ventilation · 3.90mm/px · 4 acquisitions, 8 frames shown]
[im 1/4]
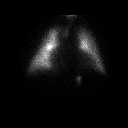
[im 1/4]
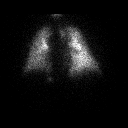
[im 2/4]
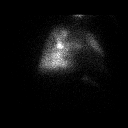
[im 2/4]
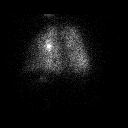
[im 3/4  full-range]
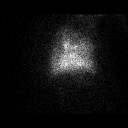
[im 3/4  full-range]
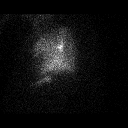
[im 4/4]
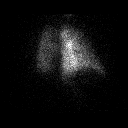
[im 4/4]
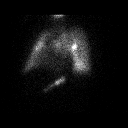

[15 of 15 positions shown; findings below may reference images not displayed]

PROCEDURE:     NM  - NM VQ LUNG SCAN  - [DATE] [DATE] [DATE]  [DATE]

RESULT:     Ventilation/perfusion lung scan is performed. [DATE] mCi of
technetium 99 M DTPA were placed in the nebulizer for ventilation images.
5.92 mCi of technetium 99 M MAA were utilized for the perfusion images.
Correlation is made with chest x-ray dated 01/01/2013 at [DATE] a.m.

Chest x-ray shows cardiomegaly with pulmonary vascular congestion and
diffuse interstitial edema suggestive of congestive heart failure. The
ventilation images show a mildly heterogeneous pattern of activity in both
lungs without significant segmental or subsegmental defect.

The perfusion images show no significant defect and are more homogeneous in
appearance.
IMPRESSION: Findings consistent with congestive heart failure. No
findings of pulmonary embolism suggested. Given the heterogeneity of the
ventilation images the study is read as low probability for pulmonary
embolism.

[REDACTED](*)

## 2014-11-19 IMAGING — CR DG CHEST 2V
1 series · 5 of 5 positions shown · non-contrast
Comparison: none

REASON FOR EXAM: hypoxia, fever
COMMENTS:

[Series 1: pa · 0.17mm/px · 5 of 5 slices shown]
[im 1/5]
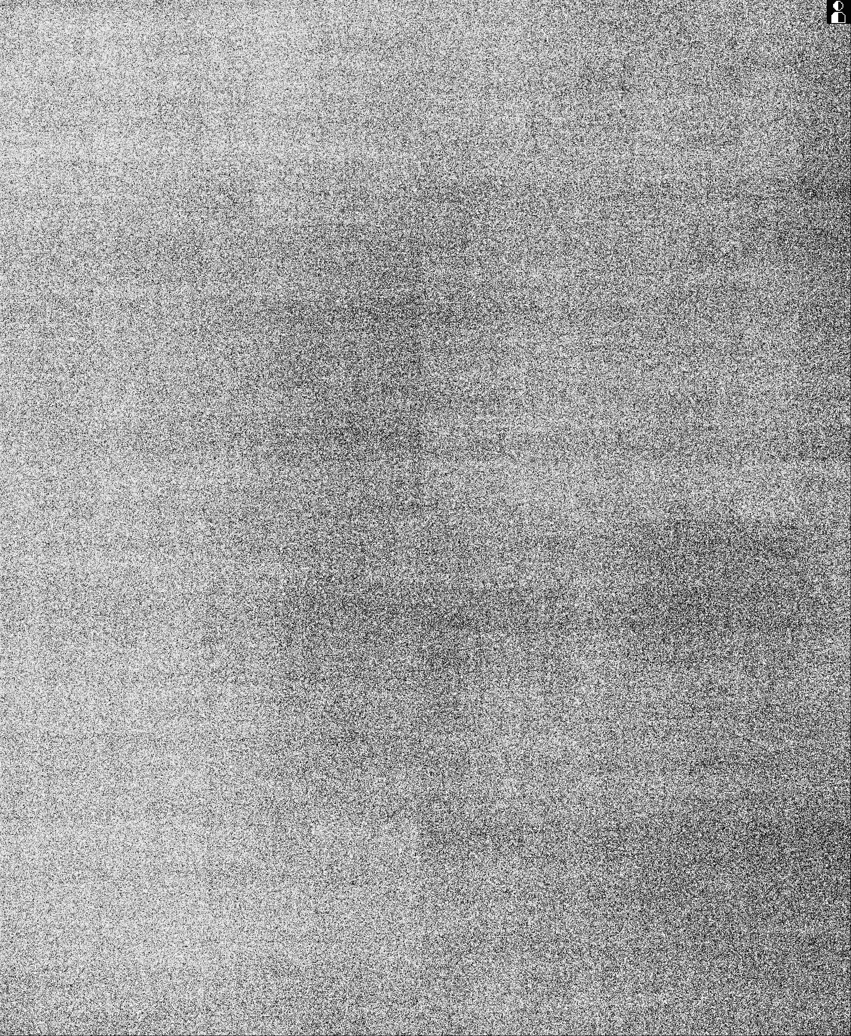
[im 2/5]
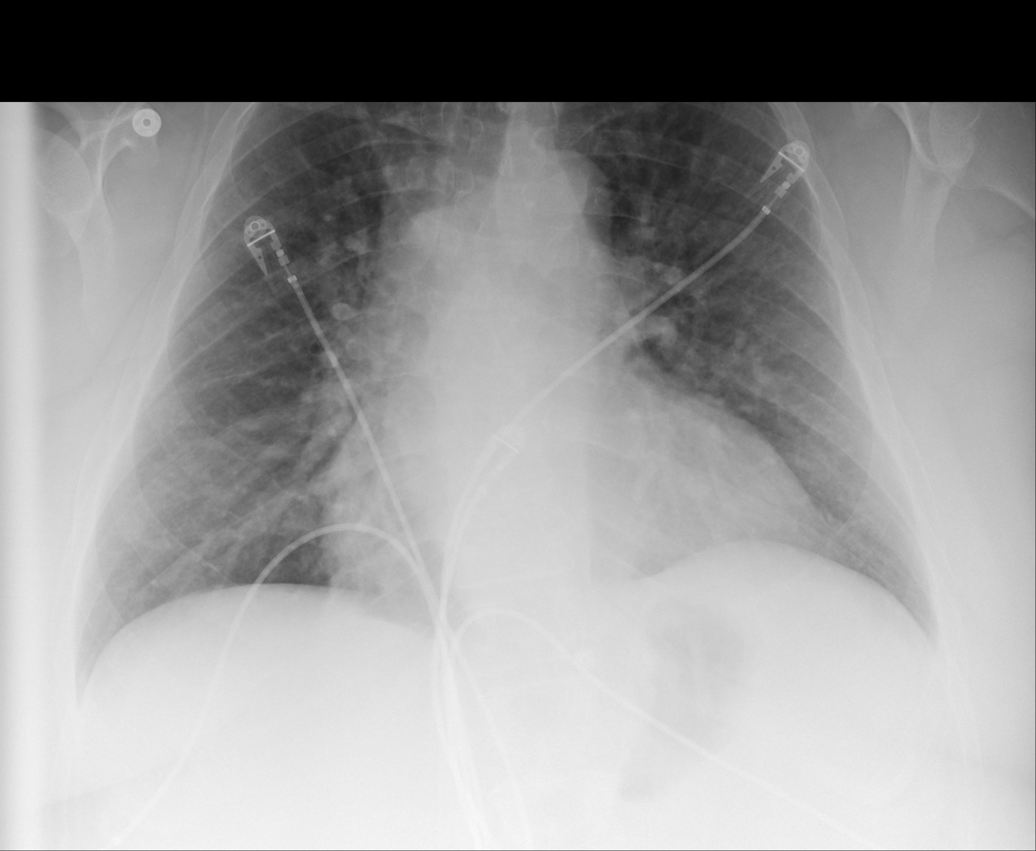
[im 3/5]
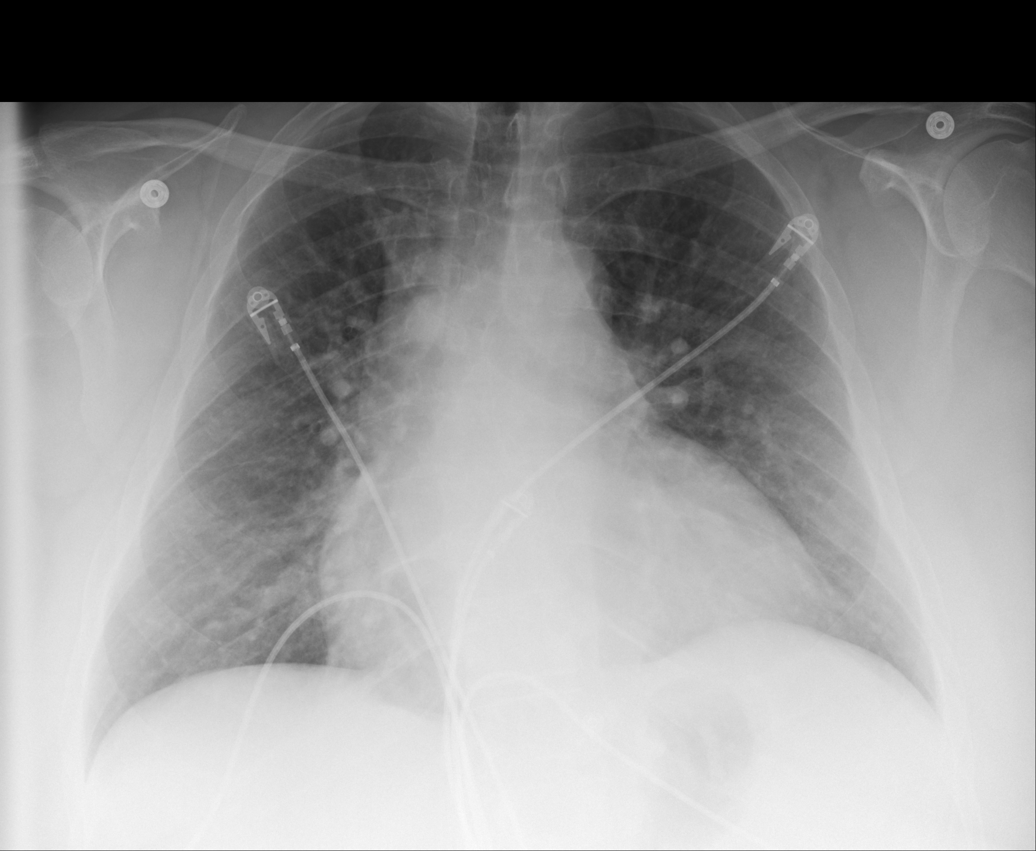
[im 4/5]
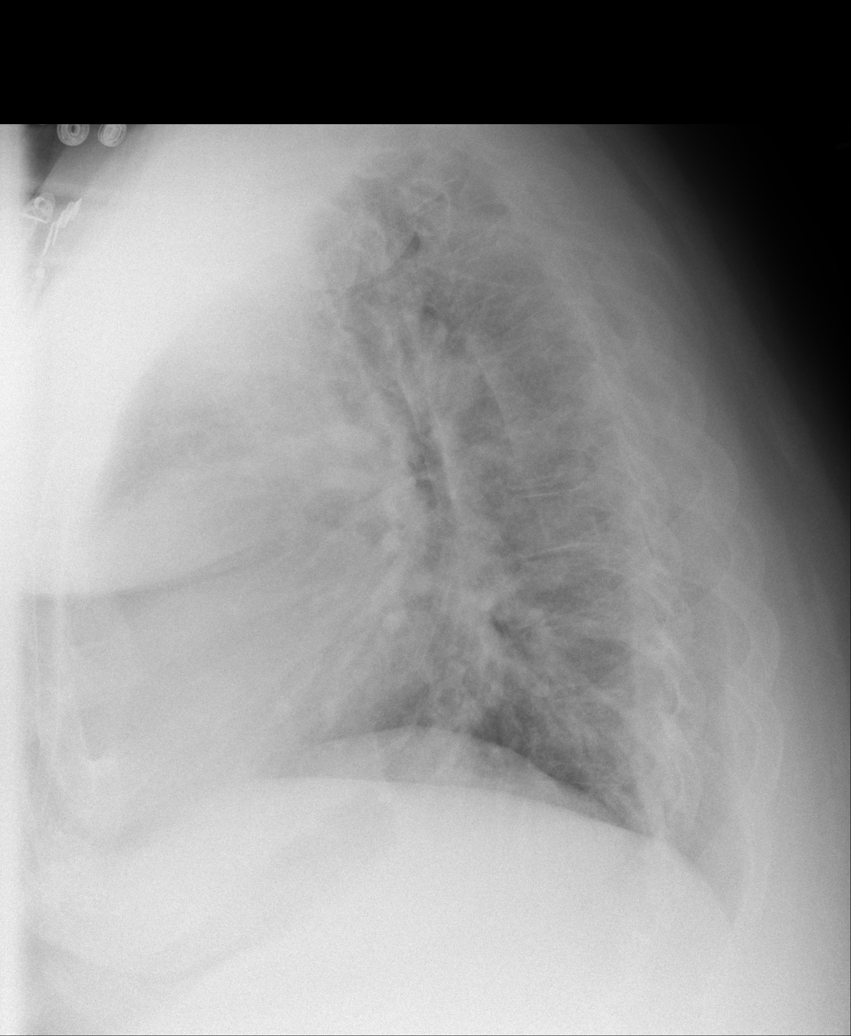
[im 5/5]
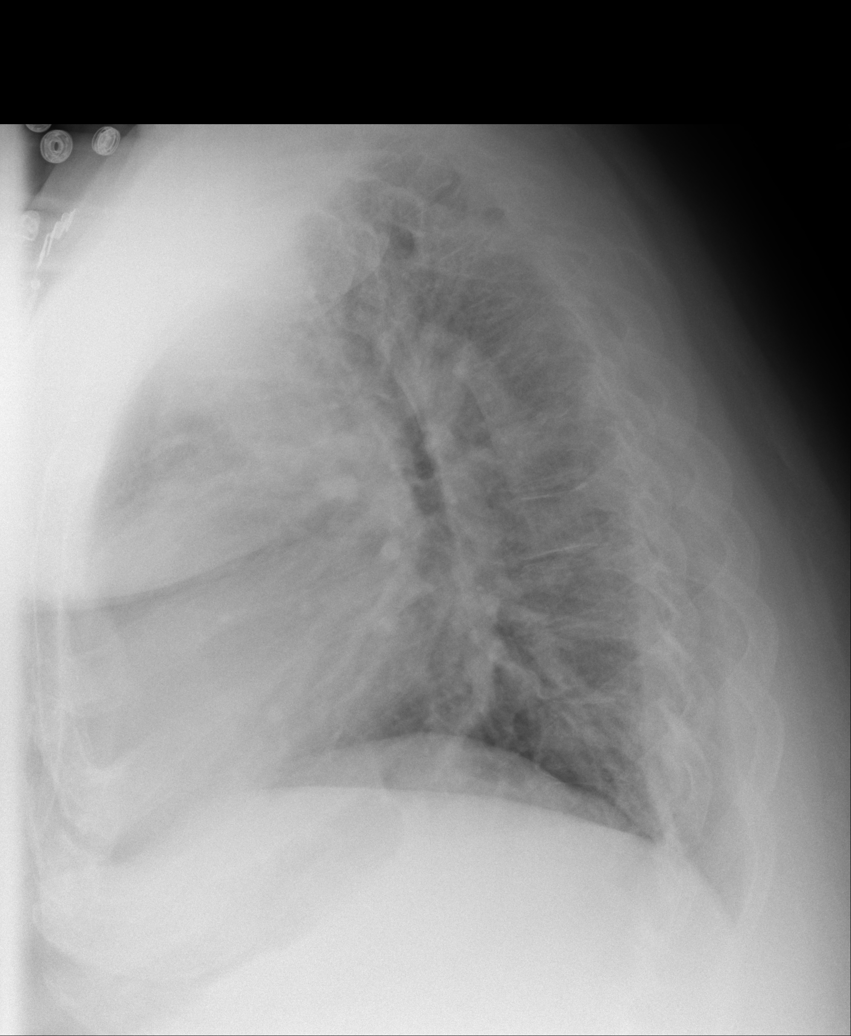

[5 of 5 positions shown; findings below may reference images not displayed]

PROCEDURE:     DXR - DXR CHEST PA (OR AP) AND LATERAL  - January 01, 2013  [DATE]

RESULT:     Comparison is made to the study of 12/31/2012.

Cardiac silhouette is enlarged. There is pulmonary vascular congestion with
diffuse interstitial edema. No large effusion is seen. A trace effusion
cannot be excluded.
IMPRESSION: Cardiomegaly with pulmonary edema consistent with
congestive heart failure. Diffuse interstitial pneumonitis is felt to be
less likely.

[REDACTED]

## 2015-05-26 ENCOUNTER — Telehealth: Payer: Self-pay | Admitting: *Deleted

## 2015-05-26 NOTE — Telephone Encounter (Signed)
Received records request from [employer], [facility] 05/26/15 .

## 2015-06-06 ENCOUNTER — Telehealth: Payer: Self-pay | Admitting: Cardiovascular Disease

## 2015-06-06 NOTE — Telephone Encounter (Signed)
Attempted to schedle 6 m fu from recall list.  Patient says he is doing ok and prefers to do 12 m fu instead.  Deleting recall.  Instructed patient to call back when ready to schedule fu

## 2015-06-21 ENCOUNTER — Telehealth: Payer: Self-pay

## 2015-06-21 NOTE — Telephone Encounter (Signed)
Received records request [employer] Dis[employer], [facility] to [facility] for processing.

## 2015-07-13 ENCOUNTER — Telehealth: Payer: Self-pay

## 2015-07-13 NOTE — Telephone Encounter (Signed)
Received records request[employer]. [employer] [place] [place]. Dis[employer]-Long [employer], forwarded to [facility] for processing.

## 2015-07-21 ENCOUNTER — Telehealth: Payer: Self-pay

## 2015-07-21 NOTE — Telephone Encounter (Signed)
Received records request [address], [employer] [place] [place], 2nd request, forwarded to [employer] for processing.

## 2016-03-04 NOTE — Progress Notes (Deleted)
Cardiology Office Note Date:  03/04/2016  Patient ID:  [name] 1951/05/15, MRN [id number] PCP:  Gavin Goodell, MD  Cardiologist:  Dr. Mariah Milling, MD  ***refresh   Chief Complaint: Pre-procedure cardiac evaluation   History of Present Illness: [name] is a 65 y.o. male with history of chronic diastolic CHF, cirrhosis of the liver, NASH, esophageal varcies on prior EGD, anemia of chronic disease s/p prior transfusion, morbid obesity, chronic LE edema, history of tobacco abuse x 25 years quitting in 2000, prior admission for bacteremia/septicemia, and OSA on CPAP who presents for pre-procedure evaluation for EGD.   He was last seen by [name] on 11/16/2014, and noted increased SOB at that time. It was felt his symptoms were 2/2 his anemia at that time and he was continued on his current medication regimen. Prior echo from 01/02/2013 shwoed normal LV systolic function, mildly dilated left atrium, and moderately elevated LVEDP at 40 mm Hg. Most recent echo from 02/2014 showed normal LV systolic function, biatrial dilation, and DD.  Patient was scheduled for an EGD on 02/23/2016. However, pre-procedure EKG on 02/23/2016 was read as NSR with 1st degree AV block and sinus arrhythmia, 60 bpm, nonspecific st/t wave changes (more evident than prior study). His procedure was cancelled 2/2 the above EKG and he was advised to follow up with cardiolgoy for pre-procedure evaluation.    ***   Past Medical History:  Diagnosis Date  . Acute diastolic CHF (congestive heart failure)   . Anal fissure 2014  . Anemia 2014  . BPH (benign prostatic hypertrophy)   . Carpal tunnel syndrome   . Cirrhosis of liver   . Gastric ulcer   . Hypertension   . Inguinal hernia   . Liver disease   . Lower extremity edema   . Pseudomonas septicemia   . Sleep apnea    CPAP    Past Surgical History:  Procedure Laterality Date  . CARPAL TUNNEL RELEASE    . HEMORRHOID SURGERY    . INGUINAL  HERNIA REPAIR      Current Outpatient Prescriptions  Medication Sig Dispense Refill  . baclofen (LIORESAL) 10 MG tablet Take 10 mg by mouth 3 (three) times daily.    . furosemide (LASIX) 40 MG tablet Take 3 tablets every am, 2 tablets every pm.    . LACTULOSE PO Take 30 mLs by mouth daily.     . mometasone (NASONEX) 50 MCG/ACT nasal spray Place 2 sprays into the nose daily as needed.     . nadolol (CORGARD) 20 MG tablet Take 20 mg by mouth daily.      . NON FORMULARY CPAP at bedtime.    Marland Kitchen omeprazole (PRILOSEC) 20 MG capsule Take 20 mg by mouth 2 (two) times daily before a meal.     . potassium chloride (K-DUR) 10 MEQ tablet Take 1 tablet (10 mEq total) by mouth daily as needed. 30 tablet 3  . rifaximin (XIFAXAN) 550 MG TABS tablet Take 550 mg by mouth 2 (two) times daily.     Marland Kitchen spironolactone (ALDACTONE) 50 MG tablet Take 3 tablets in the am, 1 tablet in the pm.     No current facility-administered medications for this visit.     Allergies:   Review of patient's allergies indicates no known allergies.   Social History:  The patient  reports that he quit smoking about 16 years ago. His smoking use included Cigarettes. He has a 15.00 pack-year smoking history. He does  not have any smokeless tobacco history on file. He reports that he does not drink alcohol or use drugs.   Family History:  The patient's family history includes Coronary artery disease in his father; Diabetes in his father; Hypertension in his father; Lung cancer in his mother; Nephrolithiasis in his father; Prostate cancer in his father; Skin cancer in his father.  ROS:   ROS   PHYSICAL EXAM: *** VS:  There were no vitals taken for this visit. BMI: There is no height or weight on file to calculate BMI.  Physical Exam   EKG:  Was ordered and interpreted by me today. Shows ***  Recent Labs: No results found for requested labs within last 8760 hours.  No results found for requested labs within last 8760 hours.   CrCl  cannot be calculated (Unknown ideal weight.).   Wt Readings from Last 3 Encounters:  11/16/14 (!) 367 lb (166.5 kg)  07/21/13 (!) 366 lb 12.8 oz (166.4 kg)  07/16/13 (!) 368 lb 12 oz (167.3 kg)     Other studies reviewed: Additional studies/records reviewed today include: summarized above  ASSESSMENT AND PLAN:  1. Pre-procedure cardiac evaluation: 2. Chronic diastolic CHF: 3. Liver cirrhosis/HASH/esophageal varices: 4. Anemia of chronic disease: 5. Morbid obesity/OSA on CPAP: 6. Chronic LE edema:  Disposition: F/u with *** in   Current medicines are reviewed at length with the patient today.  The patient did not have any concerns regarding medicines.  Gavin DodgeSigned, Gavin Thien PA-C 03/04/2016 11:48 AM     [facility] [address] [place], [place] [address] [phone]

## 2016-03-05 ENCOUNTER — Encounter: Payer: Self-pay | Admitting: Cardiovascular Disease

## 2016-03-05 ENCOUNTER — Ambulatory Visit (INDEPENDENT_AMBULATORY_CARE_PROVIDER_SITE_OTHER): Payer: Medicare Other | Admitting: Cardiovascular Disease

## 2016-03-05 ENCOUNTER — Ambulatory Visit: Payer: Medicare Other | Admitting: Physician Assistant

## 2016-03-05 VITALS — BP 130/72 | HR 79 | Ht 71.0 in | Wt 393.5 lb

## 2016-03-05 DIAGNOSIS — R0602 Shortness of breath: Secondary | ICD-10-CM | POA: Diagnosis not present

## 2016-03-05 DIAGNOSIS — K7460 Unspecified cirrhosis of liver: Secondary | ICD-10-CM

## 2016-03-05 DIAGNOSIS — D649 Anemia, unspecified: Secondary | ICD-10-CM

## 2016-03-05 DIAGNOSIS — R600 Localized edema: Secondary | ICD-10-CM

## 2016-03-05 DIAGNOSIS — I5032 Chronic diastolic (congestive) heart failure: Secondary | ICD-10-CM | POA: Diagnosis not present

## 2016-03-05 DIAGNOSIS — I4891 Unspecified atrial fibrillation: Secondary | ICD-10-CM

## 2016-03-05 MED ORDER — NADOLOL 20 MG PO TABS: 20.0000 mg | ORAL_TABLET | Freq: Every day | ORAL | 6 refills | Status: DC

## 2016-03-05 MED ORDER — TORSEMIDE 100 MG PO TABS: 100.0000 mg | ORAL_TABLET | Freq: Two times a day (BID) | ORAL | 6 refills | Status: AC

## 2016-03-05 NOTE — Patient Instructions (Addendum)
Medication Instructions:   We will try to touch base with Dr. Ruffin FrederickBarritt  Stay on torsemide 100 mg in the Am, If weight does not change, Start adding 1/2 torsemide in the afternoon  Retsart 1/2 dose nadolol  Monitor pulse rate in the afternoon and evening  Labwork:  No new labs needed  Testing/Procedures:  We will schedule an echo for new atrial fibrillation Echocardiography is a painless test that uses sound waves to create images of your heart. It provides your doctor with information about the size and shape of your heart and how well your heart's chambers and valves are working. This procedure takes approximately one hour. There are no restrictions for this procedure.   Follow-Up: It was a pleasure seeing you in the office today. Please call us if you have new issues that need to be addressed before your next appt.  [phone]  Your physician wants you to follow-up in: 1 month.   If you need a refill on your cardiac medications before your next appointment, please call your pharmacy.   Echocardiogram An echocardiogram, or echocardiography, uses sound waves (ultrasound) to produce an image of your heart. The echocardiogram is simple, painless, obtained within a short period of time, and offers valuable information to your health care provider. The images from an echocardiogram can provide information such as:  Evidence of coronary artery disease (CAD).  Heart size.  Heart muscle function.  Heart valve function.  Aneurysm detection.  Evidence of a past heart attack.  Fluid buildup around the heart.  Heart muscle thickening.  Assess heart valve function. LET [facility]OUR [facility]ARE PROVIDER KNOW ABOUT:  Any allergies you have.  All medicines you are taking, including vitamins, herbs, eye drops, creams, and over-the-counter medicines.  Previous problems you or members of your family have had with the use of anesthetics.  Any blood disorders you have.  Previous  surgeries you have had.  Medical conditions you have.  Possibility of pregnancy, if this applies. BEFORE THE PROCEDURE  No special preparation is needed. Eat and drink normally.  PROCEDURE   In order to produce an image of your heart, gel will be applied to your chest and a wand-like tool (transducer) will be moved over your chest. The gel will help transmit the sound waves from the transducer. The sound waves will harmlessly bounce off your heart to allow the heart images to be captured in real-time motion. These images will then be recorded.  You may need an IV to receive a medicine that improves the quality of the pictures. AFTER THE PROCEDURE You may return to your normal schedule including diet, activities, and medicines, unless your health care provider tells you otherwise.   This information is not intended to replace advice given to you by your health care provider. Make sure you discuss any questions you have with your health care provider.   Document Released: 06/21/2000 Document Revised: 07/15/2014 Document Reviewed: 03/01/2013 Elsevier Interactive Patient Education [employer]2016 Elsevier Inc.

## 2016-03-05 NOTE — Progress Notes (Signed)
Cardiology Office Note  Date:  03/05/2016   ID:  [name], DOB 1951/02/22, MRN [id number]  PCP:  Marina Goodell, MD   Chief Complaint  Patient presents with  . Other    Pt. c/o shortness of breath and LE edema. Meds reviewed by the patient verbally.     HPI:  65 year old male with pmh of obesity, cirrhosis secondary to NASH, with varices on prior EGD, OSA on CPAP, smoking hx x 25 years (stopped in 2000), with chronic lower extremity edema/lymphedema. He presents for f/u.  He has a history of chronic anemia. History of transfusion with packed red blood cells x2 in June 2014. history of varices and portal hypertension. He presents for follow-up of his chronic diastolic CHF  In follow-up, he reports that he was scheduled for EGD at [facility] secondary to new finding of atrial fibrillation  Reports he is had dramatic weight gain well above his baseline 366 pounds Hit a Peak weight 399 pounds, Was taking Lasix, this was not working, only recently changed Back to torsemide for the past 2 days, weight now at home around 387.  Wife reports he was trying to lose weight with Weight [employer] approximately 2 months ago Can not tell when atrial fibrillation started "I do drink some". Mouth feels dry all the time Worsening leg swelling, difficulty walking, presents in a wheelchair Fatigue, short of breath on exertion  [facility] records reviewed with him in detail occluded lab work  Previously was taking Lasix 120 mg in the morning, 80 mg the afternoon, Aldactone 150 mg in the morning, 50 mg in the afternoon He receives 3 units of albumin every week  chronic anemia with recent hematocrit 27, INR 2.0  Wife who presents with him today reports that he no longer takes nadolol. Was previously taking 20 mg daily  EKG on today's visit shows normal sinus rhythm with rate 79 bpm, no significant ST or T-wave changes  Other past medical history  He follows up with Dr. Raford Pitcher at [facility] for his liver issues. MRI done in December 2010.  Echocardiogram also reviewed with him showing normal LV systolic function, dilated right and left atrium, diastolic dysfunction Study was done at [facility] August 2015  Previous evaluation in the emergency room January 2016 documenting acute hepatic encephalopathy, pancytopenia  admitted to the hospital June 2012 for acute respiratory failure, started on BiPAP, also with hypotension, acute renal failure secondary to hypovolemia. He was in the ICU for several days on BiPAP and pressors. It was felt that he had bacteremia as he had chills and fever prior to presentation. Notes indicate etiology of his infection was uncertain. He was started on Zosyn for 7 days. Possibly related to his underlying liver disease (?SBP).  BUN 2 6 and creatinine 2.07 on arrival to the hospital. Creatinine on discharge was 1.0   admission June 26  for Pseudomonas septicemia, acute diastolic CHF. He had antibiotics, diuresis with improvement of his symptoms. Since discharge, he has been taking Lasix 40 mg twice a day. Report significant weight loss since his discharge.He had packed red blood cells x2.  Total cholesterol 181, LDL 118, hemoglobin A1c 5.7  Echocardiogram in hospital 01/02/2013 showed normal LV function, mildly dilated left atrium, moderate elevated right ventricular systolic pressures estimated at 40 mm mercury   PMH:   has a past medical history of Acute diastolic CHF (congestive heart failure) (HCC); Anal fissure (2014); Anemia (2014); BPH (benign prostatic hypertrophy); Carpal tunnel syndrome;  Cirrhosis of liver (HCC); Gastric ulcer; Hypertension; Inguinal hernia; Liver disease; Lower extremity edema; Pseudomonas septicemia (HCC); and Sleep apnea.  PSH:    Past Surgical History:  Procedure Laterality Date  . CARPAL TUNNEL RELEASE    . HEMORRHOID SURGERY    . INGUINAL HERNIA REPAIR      Current Outpatient Prescriptions  Medication Sig Dispense  Refill  . baclofen (LIORESAL) 10 MG tablet Take 10 mg by mouth 3 (three) times daily.    Marland Kitchen. LACTULOSE PO Take 30 mLs by mouth daily.     . mometasone (NASONEX) 50 MCG/ACT nasal spray Place 2 sprays into the nose daily as needed.     . nadolol (CORGARD) 20 MG tablet Take 1 tablet (20 mg total) by mouth daily. 30 tablet 6  . NON FORMULARY CPAP at bedtime.    Marland Kitchen. omeprazole (PRILOSEC) 20 MG capsule Take 20 mg by mouth 2 (two) times daily before a meal.     . potassium chloride (K-DUR) 10 MEQ tablet Take 1 tablet (10 mEq total) by mouth daily as needed. 30 tablet 3  . rifaximin (XIFAXAN) 550 MG TABS tablet Take 550 mg by mouth 2 (two) times daily.     Marland Kitchen. spironolactone (ALDACTONE) 50 MG tablet Take 3 tablets in the am, 1 tablet in the pm.    . torsemide (DEMADEX) 100 MG tablet Take 1 tablet (100 mg total) by mouth 2 (two) times daily. 60 tablet 6   No current facility-administered medications for this visit.      Allergies:   Review of patient's allergies indicates no known allergies.   Social History:  The patient  reports that he quit smoking about 16 years ago. His smoking use included Cigarettes. He has a 15.00 pack-year smoking history. He has never used smokeless tobacco. He reports that he does not drink alcohol or use drugs.   Family History:   family history includes Coronary artery disease in his father; Diabetes in his father; Hypertension in his father; Lung cancer in his mother; Nephrolithiasis in his father; Prostate cancer in his father; Skin cancer in his father.    Review of Systems: Review of Systems  Constitutional: Positive for malaise/fatigue.  Respiratory: Positive for shortness of breath.   Cardiovascular: Positive for leg swelling.  Gastrointestinal: Negative.   Musculoskeletal: Negative.   Neurological: Positive for weakness.  Psychiatric/Behavioral: Negative.   All other systems reviewed and are negative.    PHYSICAL EXAM: VS:  BP 130/72 (BP Location: Left Arm,  Patient Position: Sitting, Cuff Size: Normal)   Pulse 79   Ht 5\' 11"  (1.803 m)   Wt (!) 393 lb 8 oz (178.5 kg)   SpO2 98%   BMI 54.88 kg/m  , BMI Body mass index is 54.88 kg/m. GEN: Morbidly obese presenting in a wheelchair, appears mildly  jaundice HEENT: normal  Neck: no JVD, carotid bruits, or masses Cardiac: RRR; no murmurs, rubs, or gallops, Tremendously distended edematous, Woody lower extremities with pitting  Respiratory:  clear to auscultation bilaterally, normal work of breathing GI: soft, nontender, nondistended, + BS MS: no deformity or atrophy  Skin: warm and dry, no rash Neuro:  Strength and sensation are intact, not fully tested  Psych: euthymic mood, full affect    Recent Labs: No results found for requested labs within last 8760 hours.    Lipid Panel No results found for: CHOL, HDL, LDLCALC, TRIG    Wt Readings from Last 3 Encounters:  03/05/16 (!) 393 lb 8 oz (178.5  kg)  11/16/14 (!) 367 lb (166.5 kg)  07/21/13 (!) 366 lb 12.8 oz (166.4 kg)       ASSESSMENT AND PLAN:  SOB (shortness of breath) - Plan: EKG 12-Lead, ECHOCARDIOGRAM COMPLETE Short of breath on exertion likely secondary to morbid obesity, deconditioning, Underlying new atrial fibrillation in the setting of acute on chronic diastolic CHF Will work on his diuretic regiment given he is up 30 pounds  Atrial fibrillation, unspecified type (HCC) - Plan: ECHOCARDIOGRAM COMPLETE Limited options, will restart low-dose nadolol 10 mg to start  with titration up to 10 mg twice a day for rate control. Discussed case with Dr. Graciela Husbands. He would appear he is a poor candidate for anticoagulation with warfarin or NOACs. He is already auto anticoagulated given his underlying cirrhosis of liver disease. Unclear if this will protect him, some literature indication he may have a higher risk of thromboembolic disease. He does have petechial bruising notable on exam. Discussed the conundrum with patient and his  wife. We will not start anticoagulation at this time. We'll pursue a rate control strategy alone   Morbid obesity due to excess calories (HCC) Unable to lose weight effectively, wife reports he tried Weight Watchers with limited success    Chronic diastolic CHF (congestive heart failure) (HCC)  dramatic 30 pound weight gain in the past year  Echocardiogram pending to evaluate ejection fraction given his new heart arrhythmia  Encouraged him to stay on torsemide 100 mrem in the morning . If he continues to lose weight, would closely monitor renal function . If no dramatic weight loss, may need to add torsemide 50 mg in the afternoon   Cirrhosis of liver without ascites, unspecified hepatic cirrhosis type (HCC)  followed at [facility] albumin a frequent basis,  Now with worsening fluid condition   Anemia, unspecified anemia type  stable but chronic anemia   Bilateral leg edema Secondary to obesity, diastolic CHF   likely component of venous insufficiency Recommended leg elevation, could try Ace wraps for any skin breakdown    Total encounter time more than 40 minutes  Greater than 50% was spent in counseling and coordination of care with the patient   Disposition:   F/U  1 month   Orders Placed This Encounter  Procedures  . EKG 12-Lead  . ECHOCARDIOGRAM COMPLETE     Signed, Dossie Arbour, M.D., Ph.D. 03/05/2016  [facility] [place], [place] [phone]

## 2016-03-21 ENCOUNTER — Other Ambulatory Visit: Payer: Self-pay

## 2016-03-21 ENCOUNTER — Ambulatory Visit (INDEPENDENT_AMBULATORY_CARE_PROVIDER_SITE_OTHER): Payer: Medicare Other

## 2016-03-21 DIAGNOSIS — R0602 Shortness of breath: Secondary | ICD-10-CM

## 2016-03-21 DIAGNOSIS — I4891 Unspecified atrial fibrillation: Secondary | ICD-10-CM

## 2016-04-08 ENCOUNTER — Encounter: Payer: Self-pay | Admitting: Cardiovascular Disease

## 2016-04-08 ENCOUNTER — Ambulatory Visit (INDEPENDENT_AMBULATORY_CARE_PROVIDER_SITE_OTHER): Payer: Medicare Other | Admitting: Cardiovascular Disease

## 2016-04-08 VITALS — BP 141/82 | HR 70 | Ht 71.0 in | Wt 391.2 lb

## 2016-04-08 DIAGNOSIS — I5032 Chronic diastolic (congestive) heart failure: Secondary | ICD-10-CM

## 2016-04-08 DIAGNOSIS — R600 Localized edema: Secondary | ICD-10-CM

## 2016-04-08 DIAGNOSIS — R0602 Shortness of breath: Secondary | ICD-10-CM | POA: Diagnosis not present

## 2016-04-08 DIAGNOSIS — I4891 Unspecified atrial fibrillation: Secondary | ICD-10-CM | POA: Diagnosis not present

## 2016-04-08 DIAGNOSIS — I890 Lymphedema, not elsewhere classified: Secondary | ICD-10-CM

## 2016-04-08 DIAGNOSIS — K7460 Unspecified cirrhosis of liver: Secondary | ICD-10-CM

## 2016-04-08 DIAGNOSIS — D649 Anemia, unspecified: Secondary | ICD-10-CM

## 2016-04-08 NOTE — Progress Notes (Signed)
Cardiology Office Note  Date:  04/08/2016   ID:  [name], DOB 01/16/1951, MRN [id number]  PCP:  Marina Goodell, MD   Chief Complaint  Patient presents with  . other    1 month follow up. Pt. c/o shortness of breath, LE edema and feeling like coming down with a cold.     HPI:  65 year old male with pmh of obesity, cirrhosis secondary to NASH, with varices on prior EGD, OSA on CPAP, smoking hx x 25 years (stopped in 2000), with chronic lower extremity edema/lymphedema. He presents for f/u Of his persistent atrial fibrillation and chronic diastolic CHF. history of chronic anemia., transfusion with packed red blood cells   history of varices and portal hypertension.  On his last clinic visit we discussed the ramifications of his atrial fibrillation with him He was felt to be not a good candidate for anticoagulation given varices, underlying liver disease, though we did recommend he talk with hepatology  In follow-up today he is taking torsemide 100 mg every morning Reports his weight is stable if not slightly improved Continues to have severe leg edema, is not using his lymphedema compression pump Wife reports that the pumps are hard to put on when the legs get this vague He is walking with a cane, no recent falls Reports frequent nocturia Denies any significant PND or orthopnea Previously was trying to lose weight, has given up on this. Previously following Weight [employer]. Daily peak weight 399 pounds Lasix previously not working for him, changed back to torsemide He is not taking that along, takes carvedilol  Lab work reviewed HCT 32, Creatinine 1.2 BUN 24  EKG on today's visit shows atrial fibrillation/flutter ventricular rate 73 bpm, no significant ST or T-wave changes  Other past medical history Previously scheduled for EGD at [facility] secondary to new finding of atrial fibrillation  He receives 3 units of albumin every week  chronic anemia with recent  hematocrit 27 to 32, INR 2.0   He follows up with Dr. Raford Pitcher at [facility] for his liver issues. MRI done in December 2010.  Echocardiogram also reviewed with him showing normal LV systolic function, dilated right and left atrium, diastolic dysfunction Study was done at [facility] August 2015  Previous evaluation in the emergency room January 2016 documenting acute hepatic encephalopathy, pancytopenia  admitted to the hospital June 2012 for acute respiratory failure, started on BiPAP, also with hypotension, acute renal failure secondary to hypovolemia. He was in the ICU for several days on BiPAP and pressors. It was felt that he had bacteremia as he had chills and fever prior to presentation. Notes indicate etiology of his infection was uncertain. He was started on Zosyn for 7 days. Possibly related to his underlying liver disease (?SBP).  BUN 2 6 and creatinine 2.07 on arrival to the hospital. Creatinine on discharge was 1.0   admission June 26  for Pseudomonas septicemia, acute diastolic CHF. He had antibiotics, diuresis with improvement of his symptoms. Since discharge, he has been taking Lasix 40 mg twice a day. Report significant weight loss since his discharge.He had packed red blood cells x2.  Total cholesterol 181, LDL 118, hemoglobin A1c 5.7  Echocardiogram in hospital 01/02/2013 showed normal LV function, mildly dilated left atrium, moderate elevated right ventricular systolic pressures estimated at 40 mm mercury   PMH:   has a past medical history of Acute diastolic CHF (congestive heart failure) (HCC); Anal fissure (2014); Anemia (2014); BPH (benign prostatic hypertrophy); Carpal tunnel  syndrome; Cirrhosis of liver (HCC); Gastric ulcer; Hypertension; Inguinal hernia; Liver disease; Lower extremity edema; Pseudomonas septicemia (HCC); and Sleep apnea.  PSH:    Past Surgical History:  Procedure Laterality Date  . CARPAL TUNNEL RELEASE    . HEMORRHOID SURGERY    . INGUINAL  HERNIA REPAIR      Current Outpatient Prescriptions  Medication Sig Dispense Refill  . baclofen (LIORESAL) 10 MG tablet Take 10 mg by mouth 3 (three) times daily.    . carvedilol (COREG) 3.125 MG tablet Take 3.125 mg by mouth 2 (two) times daily with a meal.    . LACTULOSE PO Take 30 mLs by mouth daily.     . mometasone (NASONEX) 50 MCG/ACT nasal spray Place 2 sprays into the nose daily as needed.     . NON FORMULARY CPAP at bedtime.    Marland Kitchen. omeprazole (PRILOSEC) 20 MG capsule Take 20 mg by mouth 2 (two) times daily before a meal.     . potassium chloride (K-DUR) 10 MEQ tablet Take 1 tablet (10 mEq total) by mouth daily as needed. 30 tablet 3  . rifaximin (XIFAXAN) 550 MG TABS tablet Take 550 mg by mouth 2 (two) times daily.     Marland Kitchen. spironolactone (ALDACTONE) 50 MG tablet Take 3 tablets in the am, 1 tablet in the pm.    . torsemide (DEMADEX) 100 MG tablet Take 1 tablet (100 mg total) by mouth 2 (two) times daily. 60 tablet 6   No current facility-administered medications for this visit.      Allergies:   Review of patient's allergies indicates no known allergies.   Social History:  The patient  reports that he quit smoking about 16 years ago. His smoking use included Cigarettes. He has a 15.00 pack-year smoking history. He has never used smokeless tobacco. He reports that he does not drink alcohol or use drugs.   Family History:   family history includes Coronary artery disease in his father; Diabetes in his father; Hypertension in his father; Lung cancer in his mother; Nephrolithiasis in his father; Prostate cancer in his father; Skin cancer in his father.    Review of Systems: Review of Systems  Constitutional: Positive for malaise/fatigue.  Respiratory: Positive for shortness of breath.   Cardiovascular: Positive for leg swelling.  Gastrointestinal: Negative.   Musculoskeletal: Negative.   Neurological: Positive for weakness.  Psychiatric/Behavioral: Negative.   All other systems  reviewed and are negative.    PHYSICAL EXAM: VS:  BP (!) 141/82 (BP Location: Right Wrist, Patient Position: Sitting, Cuff Size: Normal)   Pulse 70   Ht 5\' 11"  (1.803 m)   Wt (!) 391 lb 4 oz (177.5 kg)   BMI 54.57 kg/m  , BMI Body mass index is 54.57 kg/m. GEN: Morbidly obese presenting in a wheelchair, appears mildly  jaundice HEENT: normal  Neck: no JVD, carotid bruits, or masses Cardiac: RRR; no murmurs, rubs, or gallops, Tremendously distended lower extremities,  edematous, Woody lower extremities with pitting  Respiratory:  clear to auscultation bilaterally, normal work of breathing GI: soft, nontender, nondistended, + BS MS: no deformity or atrophy  Skin: warm and dry, no rash Neuro:  Strength and sensation are intact, not fully tested  Psych: euthymic mood, full affect    Recent Labs: No results found for requested labs within last 8760 hours.    Lipid Panel No results found for: CHOL, HDL, LDLCALC, TRIG    Wt Readings from Last 3 Encounters:  04/08/16 (!) 391 lb  4 oz (177.5 kg)  03/05/16 (!) 393 lb 8 oz (178.5 kg)  11/16/14 (!) 367 lb (166.5 kg)       ASSESSMENT AND PLAN:  SOB (shortness of breath) - Plan: EKG 12-Lead, ECHOCARDIOGRAM COMPLETE Short of breath on exertion likely secondary to morbid obesity, deconditioning, atrial fibrillation in the setting of acute on chronic diastolic CHF He did not try extra torsemide in the afternoon Recommended he try this several days per week in addition to 100 mg every morning We did discuss other diuretic options such as adding metolazone once or twice per week  Atrial fibrillation, unspecified type (HCC) - Plan: ECHOCARDIOGRAM COMPLETE Limited options,  Rate relatively well-controlled on carvedilol Previously Discussed case with Dr. Graciela Husbands, EP  poor candidate for anticoagulation with warfarin or NOACs.  auto anticoagulated from his underlying liver disease,  cirrhosis  We'll continue current medications,  recommended he discuss anticoagulation with hepatology Given varices, and other details above, this increases risk of bleeding  Morbid obesity due to excess calories (HCC) Unable to lose weight effectively,  Unable to exercise   Chronic diastolic CHF (congestive heart failure) (HCC)  dramatic 30 pound weight gain in the past year  Echocardiogram pending to evaluate ejection fraction given his new heart arrhythmia  Encouraged him to stay on torsemide 100 mrem in the morning . If he continues to lose weight, would closely monitor renal function . If no dramatic weight loss, may need to add torsemide 50 mg in the afternoon   Cirrhosis of liver without ascites, unspecified hepatic cirrhosis type (HCC)  follow[facility] at [facility] albumin a frequent basis,  Now with worsening fluid condition   Anemia, unspecified anemia type  stable but chronic anemia   Bilateral leg edema Secondary to lymphedema, obesity, diastolic CHF   likely component of venous insufficiency Recommended he wear his lymphedema compression pumps. He has not been wearing these    Total encounter time more than 25 minutes  Greater than 50% was spent in counseling and coordination of care with the patient   Disposition:   F/U  6 month   Orders Placed This Encounter  Procedures  . EKG 12-Lead     Signed, Dossie Arbour, M.D., Ph.D. 04/08/2016  [facility] [place], [place] [phone]

## 2016-04-08 NOTE — Patient Instructions (Signed)

## 2016-09-18 ENCOUNTER — Emergency Department
Admission: EM | Admit: 2016-09-18 | Discharge: 2016-09-19 | Payer: Medicare Other | Attending: Emergency Medicine | Admitting: Emergency Medicine

## 2016-09-18 DIAGNOSIS — Z87891 Personal history of nicotine dependence: Secondary | ICD-10-CM | POA: Diagnosis not present

## 2016-09-18 DIAGNOSIS — Z7982 Long term (current) use of aspirin: Secondary | ICD-10-CM | POA: Diagnosis not present

## 2016-09-18 DIAGNOSIS — K922 Gastrointestinal hemorrhage, unspecified: Secondary | ICD-10-CM | POA: Insufficient documentation

## 2016-09-18 DIAGNOSIS — Z79899 Other long term (current) drug therapy: Secondary | ICD-10-CM | POA: Insufficient documentation

## 2016-09-18 DIAGNOSIS — I5032 Chronic diastolic (congestive) heart failure: Secondary | ICD-10-CM | POA: Diagnosis not present

## 2016-09-18 DIAGNOSIS — I110 Hypertensive heart disease with heart failure: Secondary | ICD-10-CM | POA: Insufficient documentation

## 2016-09-18 DIAGNOSIS — K920 Hematemesis: Secondary | ICD-10-CM | POA: Diagnosis present

## 2016-09-18 LAB — CBC WITH DIFFERENTIAL/PLATELET
BASOS ABS: 0 10*3/uL (ref 0–0.1)
BASOS PCT: 0 %
EOS ABS: 0.2 10*3/uL (ref 0–0.7)
Eosinophils Relative: 2 %
HCT: 24.3 % — ABNORMAL LOW (ref 40.0–52.0)
HEMOGLOBIN: 8.2 g/dL — AB (ref 13.0–18.0)
Lymphocytes Relative: 12 %
Lymphs Abs: 1 10*3/uL (ref 1.0–3.6)
MCH: 29.1 pg (ref 26.0–34.0)
MCHC: 33.5 g/dL (ref 32.0–36.0)
MCV: 86.9 fL (ref 80.0–100.0)
Monocytes Absolute: 0.9 10*3/uL (ref 0.2–1.0)
Monocytes Relative: 11 %
NEUTROS PCT: 75 %
Neutro Abs: 6.4 10*3/uL (ref 1.4–6.5)
Platelets: 93 10*3/uL — ABNORMAL LOW (ref 150–440)
RBC: 2.8 MIL/uL — AB (ref 4.40–5.90)
RDW: 23.3 % — ABNORMAL HIGH (ref 11.5–14.5)
WBC: 8.5 10*3/uL (ref 3.8–10.6)

## 2016-09-18 LAB — PROTIME-INR
INR: 3.18
PROTHROMBIN TIME: 33.3 s — AB (ref 11.4–15.2)

## 2016-09-18 LAB — COMPREHENSIVE METABOLIC PANEL
ALT: 34 U/L (ref 17–63)
AST: 48 U/L — AB (ref 15–41)
Albumin: 2.5 g/dL — ABNORMAL LOW (ref 3.5–5.0)
Alkaline Phosphatase: 155 U/L — ABNORMAL HIGH (ref 38–126)
Anion gap: 3 — ABNORMAL LOW (ref 5–15)
BUN: 33 mg/dL — AB (ref 6–20)
CO2: 23 mmol/L (ref 22–32)
CREATININE: 1.24 mg/dL (ref 0.61–1.24)
Calcium: 8.1 mg/dL — ABNORMAL LOW (ref 8.9–10.3)
Chloride: 97 mmol/L — ABNORMAL LOW (ref 101–111)
GFR, EST NON AFRICAN AMERICAN: 59 mL/min — AB (ref >60)
Glucose, Bld: 165 mg/dL — ABNORMAL HIGH (ref 65–99)
Potassium: 4.8 mmol/L (ref 3.5–5.1)
SODIUM: 123 mmol/L — AB (ref 135–145)
Total Bilirubin: 9.4 mg/dL — ABNORMAL HIGH (ref 0.3–1.2)
Total Protein: 5.6 g/dL — ABNORMAL LOW (ref 6.5–8.1)

## 2016-09-18 LAB — APTT: APTT: 46 s — AB (ref 24–36)

## 2016-09-18 LAB — AMMONIA: AMMONIA: 139 umol/L — AB (ref 9–35)

## 2016-09-18 MED ORDER — OCTREOTIDE LOAD VIA INFUSION
50.0000 ug | Freq: Once | INTRAVENOUS | Status: AC
  Administered 2016-09-18: 50 ug via INTRAVENOUS
  Filled 2016-09-18: qty 25

## 2016-09-18 MED ORDER — ONDANSETRON HCL 4 MG/2ML IJ SOLN
4.0000 mg | Freq: Once | INTRAMUSCULAR | Status: AC
  Administered 2016-09-18: 4 mg via INTRAVENOUS

## 2016-09-18 MED ORDER — SODIUM CHLORIDE 0.9 % IV SOLN
50.0000 ug/h | INTRAVENOUS | Status: DC
  Administered 2016-09-18: 50 ug/h via INTRAVENOUS
  Filled 2016-09-18 (×2): qty 1

## 2016-09-18 MED ORDER — CEFTRIAXONE SODIUM-DEXTROSE 1-3.74 GM-% IV SOLR: 1.0000 g | Freq: Once | INTRAVENOUS | Status: DC

## 2016-09-18 MED ORDER — ONDANSETRON HCL 4 MG/2ML IJ SOLN
INTRAMUSCULAR | Status: AC
  Administered 2016-09-18: 4 mg via INTRAVENOUS
  Filled 2016-09-18: qty 2

## 2016-09-18 MED ORDER — SODIUM CHLORIDE 0.9 % IV SOLN
8.0000 mg/h | INTRAVENOUS | Status: DC
  Administered 2016-09-18 – 2016-09-19 (×2): 8 mg/h via INTRAVENOUS
  Filled 2016-09-18: qty 80

## 2016-09-18 MED ORDER — CEFTRIAXONE SODIUM-DEXTROSE 1-3.74 GM-% IV SOLR
1.0000 g | Freq: Once | INTRAVENOUS | Status: AC
  Administered 2016-09-18: 1 g via INTRAVENOUS
  Filled 2016-09-18: qty 50

## 2016-09-18 MED ORDER — DEXTROSE 5 % IV SOLN: 1.0000 g | Freq: Once | INTRAVENOUS | Status: DC

## 2016-09-18 MED ORDER — ETOMIDATE 2 MG/ML IV SOLN
INTRAVENOUS | Status: AC
  Filled 2016-09-18: qty 10

## 2016-09-18 MED ORDER — PANTOPRAZOLE SODIUM 40 MG IV SOLR
40.0000 mg | Freq: Two times a day (BID) | INTRAVENOUS | Status: DC
  Administered 2016-09-19: 40 mg via INTRAVENOUS

## 2016-09-18 MED ORDER — SODIUM CHLORIDE 0.9 % IV SOLN: 10.0000 mL/h | Freq: Once | INTRAVENOUS | Status: DC

## 2016-09-18 MED ORDER — PANTOPRAZOLE SODIUM 40 MG IV SOLR
80.0000 mg | Freq: Once | INTRAVENOUS | Status: AC
  Administered 2016-09-18: 80 mg via INTRAVENOUS
  Filled 2016-09-18: qty 80

## 2016-09-18 NOTE — ED Provider Notes (Signed)
[facility] Emergency Department Provider Note   ____________________________________________   I have reviewed the triage vital signs and the nursing notes.   HISTORY  Chief Complaint Bloody emesis  History limited by: Altered Mental Status   HPI [name] is a 66 y.o. male who presents to the emergency department today from rehabilitation facility after starting to vomit blood. [facility] for hepatic encephalopathy. He does have a history of cirrhosis secondary to [place]. Today nursing staff noticed that he started vomiting blood. During transport EMS also noticed bloody stool as well as an episode of bloody emesis. The patient is slightly confused and does deny any abdominal pain. He denies any chest pain.   Past Medical History:  Diagnosis Date  . Acute diastolic CHF (congestive heart failure) (HCC)   . Anal fissure 2014  . Anemia 2014  . BPH (benign prostatic hypertrophy)   . Carpal tunnel syndrome   . Cirrhosis of liver (HCC)   . Gastric ulcer   . Hypertension   . Inguinal hernia   . Liver disease   . Lower extremity edema   . Pseudomonas septicemia (HCC)   . Sleep apnea    CPAP    Patient Active Problem List   Diagnosis Date Noted  . Lymphedema 04/08/2016  . Chronic diastolic CHF (congestive heart failure) (HCC) 01/14/2013  . Bilateral leg edema 01/14/2013  . Anemia 07/17/2012  . MORBID OBESITY 08/16/2009  . GASTRIC ULCER 08/16/2009  . Cirrhosis of liver (HCC) 08/16/2009  . BENIGN PROSTATIC HYPERTROPHY, WITH OBSTRUCTION 08/16/2009  . SLEEP APNEA 08/16/2009  . CARPAL TUNNEL SYNDROME, HX OF 08/16/2009  . INGUINAL HERNIA, HX OF 08/16/2009    Past Surgical History:  Procedure Laterality Date  . CARPAL TUNNEL RELEASE    . HEMORRHOID SURGERY    . INGUINAL HERNIA REPAIR      Prior to Admission medications   Medication Sig Start Date End Date Taking? Authorizing Provider   baclofen (LIORESAL) 10 MG tablet Take 10 mg by mouth 3 (three) times daily.    Historical Provider, MD  carvedilol (COREG) 3.125 MG tablet Take 3.125 mg by mouth 2 (two) times daily with a meal.    Historical Provider, MD  LACTULOSE PO Take 30 mLs by mouth daily.     Historical Provider, MD  mometasone (NASONEX) 50 MCG/ACT nasal spray Place 2 sprays into the nose daily as needed.     Historical Provider, MD  NON FORMULARY CPAP at bedtime.    Historical Provider, MD  omeprazole (PRILOSEC) 20 MG capsule Take 20 mg by mouth 2 (two) times daily before a meal.     Historical Provider, MD  potassium chloride (K-DUR) 10 MEQ tablet Take 1 tablet (10 mEq total) by mouth daily as needed. 11/16/14   Antonieta Ibaimothy J Gollan, MD  rifaximin (XIFAXAN) 550 MG TABS tablet Take 550 mg by mouth 2 (two) times daily.     Historical Provider, MD  spironolactone (ALDACTONE) 50 MG tablet Take 3 tablets in the am, 1 tablet in the pm.    Historical Provider, MD  torsemide (DEMADEX) 100 MG tablet Take 1 tablet (100 mg total) by mouth 2 (two) times daily. 03/05/16   Antonieta Ibaimothy J Gollan, MD    Allergies Patient has no known allergies.  Family History  Problem Relation Age of Onset  . Lung cancer Mother   . Prostate cancer Father   . Skin cancer Father   . Coronary artery  disease Father   . Diabetes Father   . Hypertension Father   . Nephrolithiasis Father     Social History Social History  Substance Use Topics  . Smoking status: Former Smoker    Packs/day: 1.00    Years: 15.00    Types: Cigarettes    Quit date: 05/17/1999  . Smokeless tobacco: Never Used  . Alcohol use No    Review of Systems  Constitutional: Negative for fever. Cardiovascular: Negative for chest pain. Respiratory: Negative for shortness of breath. Gastrointestinal: Negative for abdominal pain. Positive for vomiting.  Neurological: Negative for headaches, focal weakness or numbness.  10-point ROS otherwise  negative.  ____________________________________________   PHYSICAL EXAM:  VITAL SIGNS: ED Triage Vitals  Enc Vitals Group     BP 102/48     Pulse 59     Resp 24     Temp      Temp src      SpO2 100   Constitutional: Awake and alert. Confused. Jaundiced. Blood on front of gown and bedsheets. Eyes: Scleral icterus. Normal extraocular movements. ENT   Head: Normocephalic and atraumatic.   Nose: No congestion/rhinnorhea.   Mouth/Throat: Mucous membranes are moist.   Neck: No stridor. Hematological/Lymphatic/Immunilogical: No cervical lymphadenopathy. Cardiovascular: Normal rate, regular rhythm.  No murmurs, rubs, or gallops.  Respiratory: Normal respiratory effort without tachypnea nor retractions. Breath sounds are clear and equal bilaterally. No wheezes/rales/rhonchi. Gastrointestinal: Soft and non tender. No rebound. No guarding.  Genitourinary: Deferred Musculoskeletal: Normal range of motion in all extremities. No lower extremity edema. Neurologic:  Slightly slurred speech. Confused to date and time. Moves all extremities. Skin:  Skin is warm, dry and intact. No rash noted.  ____________________________________________    LABS (pertinent positives/negatives)  Labs Reviewed  APTT - Abnormal; Notable for the following:       Result Value   aPTT 46 (*)    All other components within normal limits  PROTIME-INR - Abnormal; Notable for the following:    Prothrombin Time 33.3 (*)    All other components within normal limits  AMMONIA - Abnormal; Notable for the following:    Ammonia 139 (*)    All other components within normal limits  CBC WITH DIFFERENTIAL/PLATELET - Abnormal; Notable for the following:    RBC 2.80 (*)    Hemoglobin 8.2 (*)    HCT 24.3 (*)    RDW 23.3 (*)    Platelets 93 (*)    All other components within normal limits  COMPREHENSIVE METABOLIC PANEL - Abnormal; Notable for the following:    Sodium 123 (*)    Chloride 97 (*)    Glucose,  Bld 165 (*)    BUN 33 (*)    Calcium 8.1 (*)    Total Protein 5.6 (*)    Albumin 2.5 (*)    AST 48 (*)    Alkaline Phosphatase 155 (*)    Total Bilirubin 9.4 (*)    GFR calc non Af Amer 59 (*)    Anion gap 3 (*)    All other components within normal limits  BLOOD GAS, ARTERIAL - Abnormal; Notable for the following:    pO2, Arterial 238 (*)    Acid-base deficit 2.7 (*)    All other components within normal limits  TYPE AND SCREEN  PREPARE RBC (CROSSMATCH)  PREPARE FRESH FROZEN PLASMA     ____________________________________________   EKG  I, Phineas Semen, attending physician, personally viewed and interpreted this EKG  EKG Time: 2247 Rate:  71 Rhythm: atrial flutter vs sinus Axis: normal Intervals: qtc 511 QRS: nonspecific intraventricular conduction delay ST changes: no st elevation Impression: abnormal ekg ____________________________________________    RADIOLOGY  CXR IMPRESSION:  Appliances appear in satisfactory position. Cardiac enlargement. No  evidence of active pulmonary disease.   I, Darric Plante, personally viewed and evaluated these images (plain radiographs) as part of my medical decision making, as well as reviewing the written report by the radiologist.  ____________________________________________   PROCEDURES  Procedures  CRITICAL CARE Performed by: Phineas Semen   Total critical care time: 60 minutes  Critical care time was exclusive of separately billable procedures and treating other patients.  Critical care was necessary to treat or prevent imminent or life-threatening deterioration.  Critical care was time spent personally by me on the following activities: development of treatment plan with patient and/or surrogate as well as nursing, discussions with consultants, evaluation of patient's response to treatment, examination of patient, obtaining history from patient or surrogate, ordering and performing treatments and  interventions, ordering and review of laboratory studies, ordering and review of radiographic studies, pulse oximetry and re-evaluation of patient's condition.  INTUBATION Performed by: Phineas Semen  Required items: required blood products, implants, devices, and special equipment available Patient identity confirmed: provided demographic data and hospital-assigned identification number Time out: Immediately prior to procedure a "time out" was called to verify the correct patient, procedure, equipment, support staff and site/side marked as required.  Indications: AMS, hematemeses   Intubation method: Glidescope   Preoxygenation: BVM  Sedatives: Etomidate Paralytic: Succinylcholine  Tube Size: 8-0 cuffed  Post-procedure assessment: chest rise and ETCO2 monitor Breath sounds: equal and absent over the epigastrium Tube secured with: ETT holder Chest x-ray interpreted by radiologist and me.  Chest x-ray findings: endotracheal tube in appropriate position  Patient tolerated the procedure well with no immediate complications.    ____________________________________________   INITIAL IMPRESSION / ASSESSMENT AND PLAN / ED COURSE  Pertinent labs & imaging results that were available during my care of the patient were reviewed by me and considered in my medical decision making (see chart for details).  Patient presented to the emergency department today via emergency traffic because of concerns for bloody emesis. Patient has a history of varices and cirrhosis. Patient recently had an admission for hepatic encephalopathy unfortunate he did appear encephalopathic upon arrival to the emergency department and cannot give significant history. He did however have a large amount of blood on his gown and bending. Initial blood pressure and heart rate within normal limits. However given concern for variceal bleeding octreotide and Protonix were started. Additionally he was given ceftriaxone.  The patient did have bloody emesis while here in the emergency department visualized in the emesis bag was at least 1 pint worth of bright red blood. Given the continued emesis the decision was made to intubate the patient. I did have a discussion with the wife about the pros and cons. At this point I felt that the benefits would be to control the airway in a patient with continued hematemesis. Did discuss risk of trying to intubate if we waited until he had continuous bloody emesis. The patient wife was comfortable with plan to intubate. Shortly after the patient's arrival I did asked the secretary to try to page Dr. Tobi Bastos, GI doctor on call. After multiple attempts and without being able to contact Dr. Tobi Bastos the decision was made to attempt to transfer the patient to outside facility for GI and endoscopy. Given the patient  had a previous care at [facility] was contacted. I spoke with critical care physician Dr. Cherly Hensen who was willing to accept the patient. She also recommended giving FFP as well as placing central line.  ____________________________________________   FINAL CLINICAL IMPRESSION(S) / ED DIAGNOSES  Final diagnoses:  Acute GI bleeding     Note: This dictation was prepared with Dragon dictation. Any transcriptional errors that result from this process are unintentional     Phineas Semen, MD 09/19/16 [id number]

## 2016-09-18 NOTE — ED Notes (Addendum)
Emergency blood started at 250 ml/ hr at 23:34.  BP 118/69 HR 52 NSR RR18 SpO2 98% Temp 97.8 orally   Unit number [id number] Unit verified with Raquel, RN

## 2016-09-18 NOTE — ED Triage Notes (Signed)
Pt arrived to ED from [facility] after having been [facility]. [facility] En[facility]. EMS reports sudden onset of projectile vomiting blood. Clots noted in blood and bleeding noted on sheets around pts rectum upon arrival. Pt is jaundice upon arrival to ED. PT is arousable and alert

## 2016-09-19 ENCOUNTER — Emergency Department: Payer: Medicare Other

## 2016-09-19 DIAGNOSIS — K922 Gastrointestinal hemorrhage, unspecified: Secondary | ICD-10-CM | POA: Diagnosis not present

## 2016-09-19 LAB — BLOOD GAS, ARTERIAL
ACID-BASE DEFICIT: 2.7 mmol/L — AB (ref 0.0–2.0)
Bicarbonate: 21.7 mmol/L (ref 20.0–28.0)
FIO2: 0.5
LHR: 14 {breaths}/min
Mechanical Rate: 14
O2 SAT: 99.8 %
PATIENT TEMPERATURE: 37
PCO2 ART: 35 mmHg (ref 32.0–48.0)
PEEP/CPAP: 5 cmH2O
PH ART: 7.4 (ref 7.350–7.450)
VT: 550 mL
pO2, Arterial: 238 mmHg — ABNORMAL HIGH (ref 83.0–108.0)

## 2016-09-19 LAB — ABO/RH: ABO/RH(D): O NEG

## 2016-09-19 LAB — PREPARE RBC (CROSSMATCH)

## 2016-09-19 MED ORDER — SODIUM CHLORIDE 0.9 % IV SOLN: 10.0000 mL/h | Freq: Once | INTRAVENOUS | Status: DC

## 2016-09-19 MED ORDER — FENTANYL CITRATE (PF) 100 MCG/2ML IJ SOLN: 100.0000 ug | Freq: Once | INTRAMUSCULAR | Status: DC

## 2016-09-19 MED ORDER — FENTANYL CITRATE (PF) 100 MCG/2ML IJ SOLN
200.0000 ug | Freq: Once | INTRAMUSCULAR | Status: AC
  Administered 2016-09-19: 200 ug via INTRAVENOUS

## 2016-09-19 MED ORDER — SODIUM CHLORIDE 0.9 % IV SOLN
100.0000 ug/h | INTRAVENOUS | Status: DC
  Administered 2016-09-19: 100 ug/h via INTRAVENOUS
  Filled 2016-09-19: qty 50

## 2016-09-19 MED ORDER — PROPOFOL 1000 MG/100ML IV EMUL
5.0000 ug/kg/min | Freq: Once | INTRAVENOUS | Status: DC
  Administered 2016-09-19: 5 ug/kg/min via INTRAVENOUS

## 2016-09-19 MED ORDER — PROPOFOL 1000 MG/100ML IV EMUL
INTRAVENOUS | Status: AC
  Administered 2016-09-19: 5 ug/kg/min via INTRAVENOUS
  Filled 2016-09-19: qty 100

## 2016-09-19 MED ORDER — PROPOFOL 1000 MG/100ML IV EMUL: 5.0000 ug/kg/min | INTRAVENOUS | Status: DC

## 2016-09-19 MED ORDER — SUCCINYLCHOLINE CHLORIDE 20 MG/ML IJ SOLN
INTRAMUSCULAR | Status: AC | PRN
  Administered 2016-09-19: 120 mg via INTRAVENOUS

## 2016-09-19 MED ORDER — ETOMIDATE 2 MG/ML IV SOLN
INTRAVENOUS | Status: AC | PRN
  Administered 2016-09-19: 30 mg via INTRAVENOUS

## 2016-09-19 NOTE — ED Notes (Signed)
Second unit of emergency blood complete. No reactions noted at this time.

## 2016-09-19 NOTE — ED Notes (Signed)
[facility] and is being transported out of ED at this time. Family updated and verbalized understanding of room number and [facility]

## 2016-09-19 NOTE — ED Provider Notes (Signed)
CENTRAL LINE Performed by: Merrily BrittleNeil Bellamie Turney Consent: The procedure was performed in an emergent situation. Required items: required blood products, implants, devices, and special equipment available Patient identity confirmed: arm band and provided demographic data Time out: Immediately prior to procedure a "time out" was called to verify the correct patient, procedure, equipment, support staff and site/side marked as required. Indications: vascular access Anesthetic total:0 Patient sedated: yes - intubated Preparation: skin prepped with 2% chlorhexidine Skin prep agent dried: skin prep agent completely dried prior to procedure Sterile barriers: all five maximum sterile barriers used - cap, mask, sterile gown, sterile gloves, and large sterile sheet Hand hygiene: hand hygiene performed prior to central venous catheter insertion  Location details: left IJ  Catheter type: triple lumen Catheter size: 8 Fr Pre-procedure: landmarks identified Ultrasound guidance: used Successful placement: yes Post-procedure: line sutured and dressing applied Assessment: blood return through all parts, free fluid flow, placement verified by x-ray and no pneumothorax on x-ray Patient tolerance: Patient tolerated the procedure well with no immediate complications.    Merrily BrittleNeil Jamila Slatten, MD 09/19/16 0201

## 2016-09-19 NOTE — ED Notes (Signed)
Second Emergency blood started at 250 ml/ hr at 1:00  Unit number [id number] Unit verified with Raquel, RN

## 2016-09-20 LAB — PREPARE FRESH FROZEN PLASMA: UNIT DIVISION: 0

## 2016-09-20 LAB — BPAM FFP
Blood Product Expiration Date: 201803202359
ISSUE DATE / TIME: 201803150104
Unit Type and Rh: 2800

## 2016-09-28 LAB — BPAM RBC
BLOOD PRODUCT EXPIRATION DATE: 201803272359
BLOOD PRODUCT EXPIRATION DATE: 201804042359
BLOOD PRODUCT EXPIRATION DATE: 201804042359
Blood Product Expiration Date: 201803232359
ISSUE DATE / TIME: 201803142348
ISSUE DATE / TIME: 201803160335
ISSUE DATE / TIME: 201803142348
ISSUE DATE / TIME: 201803211813
UNIT TYPE AND RH: 5100
Unit Type and Rh: 5100
Unit Type and Rh: 9500
Unit Type and Rh: 9500

## 2016-09-28 LAB — TYPE AND SCREEN
ABO/RH(D): O NEG
ANTIBODY SCREEN: NEGATIVE
UNIT DIVISION: 0
UNIT DIVISION: 0
Unit division: 0
Unit division: 0

## 2016-11-05 DEATH — deceased

## 2022-06-07 DEATH — deceased
